# Patient Record
Sex: Male | Born: 1995 | Race: White | Hispanic: No | Marital: Single | Smoking: Never smoker
Health system: Southern US, Community
[De-identification: ages and names within clinical notes are randomized; demographics above are authoritative.]

## PROBLEM LIST (undated history)

## (undated) DIAGNOSIS — F319 Bipolar disorder, unspecified: Secondary | ICD-10-CM

## (undated) DIAGNOSIS — F840 Autistic disorder: Secondary | ICD-10-CM

## (undated) DIAGNOSIS — J45909 Unspecified asthma, uncomplicated: Secondary | ICD-10-CM

## (undated) DIAGNOSIS — I341 Nonrheumatic mitral (valve) prolapse: Secondary | ICD-10-CM

## (undated) DIAGNOSIS — F419 Anxiety disorder, unspecified: Secondary | ICD-10-CM

## (undated) HISTORY — PX: MOUTH SURGERY: SHX715

---
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
## 2016-07-20 ENCOUNTER — Ambulatory Visit (INDEPENDENT_AMBULATORY_CARE_PROVIDER_SITE_OTHER): Payer: BLUE CROSS/BLUE SHIELD | Admitting: Psychology

## 2016-07-20 DIAGNOSIS — F840 Autistic disorder: Secondary | ICD-10-CM | POA: Diagnosis not present

## 2016-07-20 DIAGNOSIS — F3111 Bipolar disorder, current episode manic without psychotic features, mild: Secondary | ICD-10-CM | POA: Diagnosis not present

## 2016-08-10 ENCOUNTER — Ambulatory Visit (INDEPENDENT_AMBULATORY_CARE_PROVIDER_SITE_OTHER): Payer: BLUE CROSS/BLUE SHIELD | Admitting: Psychology

## 2016-08-10 DIAGNOSIS — F840 Autistic disorder: Secondary | ICD-10-CM | POA: Diagnosis not present

## 2016-08-10 DIAGNOSIS — F3111 Bipolar disorder, current episode manic without psychotic features, mild: Secondary | ICD-10-CM

## 2016-08-15 ENCOUNTER — Ambulatory Visit (INDEPENDENT_AMBULATORY_CARE_PROVIDER_SITE_OTHER): Payer: BLUE CROSS/BLUE SHIELD | Admitting: Psychology

## 2016-08-15 DIAGNOSIS — F3181 Bipolar II disorder: Secondary | ICD-10-CM | POA: Diagnosis not present

## 2016-08-15 DIAGNOSIS — F840 Autistic disorder: Secondary | ICD-10-CM | POA: Diagnosis not present

## 2016-08-15 DIAGNOSIS — F70 Mild intellectual disabilities: Secondary | ICD-10-CM

## 2016-09-13 ENCOUNTER — Ambulatory Visit (INDEPENDENT_AMBULATORY_CARE_PROVIDER_SITE_OTHER): Payer: BLUE CROSS/BLUE SHIELD | Admitting: Psychology

## 2016-09-13 DIAGNOSIS — F70 Mild intellectual disabilities: Secondary | ICD-10-CM | POA: Diagnosis not present

## 2016-09-13 DIAGNOSIS — F840 Autistic disorder: Secondary | ICD-10-CM

## 2016-09-13 DIAGNOSIS — F3181 Bipolar II disorder: Secondary | ICD-10-CM | POA: Diagnosis not present

## 2016-11-04 ENCOUNTER — Ambulatory Visit (INDEPENDENT_AMBULATORY_CARE_PROVIDER_SITE_OTHER): Payer: BLUE CROSS/BLUE SHIELD | Admitting: Psychology

## 2016-11-04 DIAGNOSIS — F840 Autistic disorder: Secondary | ICD-10-CM

## 2016-11-04 DIAGNOSIS — F3181 Bipolar II disorder: Secondary | ICD-10-CM | POA: Diagnosis not present

## 2016-12-13 ENCOUNTER — Ambulatory Visit (INDEPENDENT_AMBULATORY_CARE_PROVIDER_SITE_OTHER): Payer: BLUE CROSS/BLUE SHIELD | Admitting: Psychology

## 2016-12-13 DIAGNOSIS — F3181 Bipolar II disorder: Secondary | ICD-10-CM

## 2016-12-13 DIAGNOSIS — F900 Attention-deficit hyperactivity disorder, predominantly inattentive type: Secondary | ICD-10-CM

## 2016-12-13 DIAGNOSIS — F840 Autistic disorder: Secondary | ICD-10-CM | POA: Diagnosis not present

## 2016-12-26 ENCOUNTER — Ambulatory Visit: Payer: BLUE CROSS/BLUE SHIELD | Admitting: Psychology

## 2017-01-12 ENCOUNTER — Ambulatory Visit (INDEPENDENT_AMBULATORY_CARE_PROVIDER_SITE_OTHER): Payer: BLUE CROSS/BLUE SHIELD | Admitting: Psychology

## 2017-01-12 DIAGNOSIS — F70 Mild intellectual disabilities: Secondary | ICD-10-CM

## 2017-01-12 DIAGNOSIS — F840 Autistic disorder: Secondary | ICD-10-CM

## 2017-01-12 DIAGNOSIS — F3181 Bipolar II disorder: Secondary | ICD-10-CM

## 2017-01-23 ENCOUNTER — Ambulatory Visit (INDEPENDENT_AMBULATORY_CARE_PROVIDER_SITE_OTHER): Payer: BLUE CROSS/BLUE SHIELD | Admitting: Psychology

## 2017-01-23 DIAGNOSIS — F840 Autistic disorder: Secondary | ICD-10-CM

## 2017-01-23 DIAGNOSIS — F3181 Bipolar II disorder: Secondary | ICD-10-CM | POA: Diagnosis not present

## 2017-01-23 DIAGNOSIS — F70 Mild intellectual disabilities: Secondary | ICD-10-CM | POA: Diagnosis not present

## 2017-02-09 ENCOUNTER — Ambulatory Visit: Payer: BLUE CROSS/BLUE SHIELD | Admitting: Psychology

## 2017-07-11 ENCOUNTER — Other Ambulatory Visit: Payer: Self-pay | Admitting: Student

## 2017-07-11 DIAGNOSIS — R1013 Epigastric pain: Secondary | ICD-10-CM

## 2017-07-11 DIAGNOSIS — R112 Nausea with vomiting, unspecified: Secondary | ICD-10-CM

## 2017-07-14 ENCOUNTER — Ambulatory Visit
Admission: RE | Admit: 2017-07-14 | Discharge: 2017-07-14 | Disposition: A | Discharge status: Home / Self Care | Payer: BLUE CROSS/BLUE SHIELD | Source: Ambulatory Visit | Attending: Student | Admitting: Student

## 2017-07-14 DIAGNOSIS — R1013 Epigastric pain: Secondary | ICD-10-CM | POA: Insufficient documentation

## 2017-07-14 DIAGNOSIS — K219 Gastro-esophageal reflux disease without esophagitis: Secondary | ICD-10-CM | POA: Insufficient documentation

## 2017-07-14 DIAGNOSIS — R112 Nausea with vomiting, unspecified: Secondary | ICD-10-CM | POA: Insufficient documentation

## 2017-09-14 ENCOUNTER — Ambulatory Visit (INDEPENDENT_AMBULATORY_CARE_PROVIDER_SITE_OTHER): Payer: Medicaid Other | Admitting: Psychology

## 2017-09-14 DIAGNOSIS — F3181 Bipolar II disorder: Secondary | ICD-10-CM

## 2017-09-14 DIAGNOSIS — F70 Mild intellectual disabilities: Secondary | ICD-10-CM | POA: Diagnosis not present

## 2017-09-14 DIAGNOSIS — F840 Autistic disorder: Secondary | ICD-10-CM

## 2017-09-21 ENCOUNTER — Ambulatory Visit (INDEPENDENT_AMBULATORY_CARE_PROVIDER_SITE_OTHER): Payer: Medicaid Other | Admitting: Psychology

## 2017-09-21 DIAGNOSIS — F840 Autistic disorder: Secondary | ICD-10-CM

## 2017-09-21 DIAGNOSIS — F70 Mild intellectual disabilities: Secondary | ICD-10-CM

## 2017-09-21 DIAGNOSIS — F3181 Bipolar II disorder: Secondary | ICD-10-CM | POA: Diagnosis not present

## 2017-09-28 ENCOUNTER — Ambulatory Visit (INDEPENDENT_AMBULATORY_CARE_PROVIDER_SITE_OTHER): Payer: Medicaid Other | Admitting: Psychology

## 2017-09-28 DIAGNOSIS — F3181 Bipolar II disorder: Secondary | ICD-10-CM | POA: Diagnosis not present

## 2017-09-28 DIAGNOSIS — F840 Autistic disorder: Secondary | ICD-10-CM

## 2017-09-28 DIAGNOSIS — F70 Mild intellectual disabilities: Secondary | ICD-10-CM | POA: Diagnosis not present

## 2017-11-01 ENCOUNTER — Ambulatory Visit (INDEPENDENT_AMBULATORY_CARE_PROVIDER_SITE_OTHER): Payer: Medicaid Other | Admitting: Psychology

## 2017-11-01 DIAGNOSIS — F840 Autistic disorder: Secondary | ICD-10-CM

## 2017-11-01 DIAGNOSIS — F3181 Bipolar II disorder: Secondary | ICD-10-CM

## 2017-11-13 ENCOUNTER — Ambulatory Visit: Payer: Medicaid Other | Admitting: Psychology

## 2017-11-28 ENCOUNTER — Ambulatory Visit: Payer: Medicaid Other | Admitting: Psychology

## 2017-12-12 ENCOUNTER — Ambulatory Visit: Payer: Medicaid Other | Admitting: Psychology

## 2017-12-26 ENCOUNTER — Ambulatory Visit: Payer: Medicaid Other | Admitting: Psychology

## 2018-10-16 IMAGING — RF DG UGI W/ SMALL BOWEL HIGH DENSITY
8 of 11 series · 14 of 24 positions shown · non-contrast
Comparison: None.

CLINICAL DATA: Choking sensation with hematemesis.

EXAM:
UPPER GI SERIES WITH SMALL BOWEL FOLLOW-THROUGH
FLUOROSCOPY TIME:  Fluoroscopy Time:  1 minutes 54 seconds
Radiation Exposure Index (if provided by the fluoroscopic device):
159.30 mGy
Number of Acquired Spot Images: 23
TECHNIQUE: Single contrast upper GI series using thin barium performed.
Subsequently, serial images of the small bowel were obtained
including spot views of the terminal ileum.

[Series 1: t abdomen supine · 0.14mm/px · 1 of 2 slices shown]
[im 1/2]
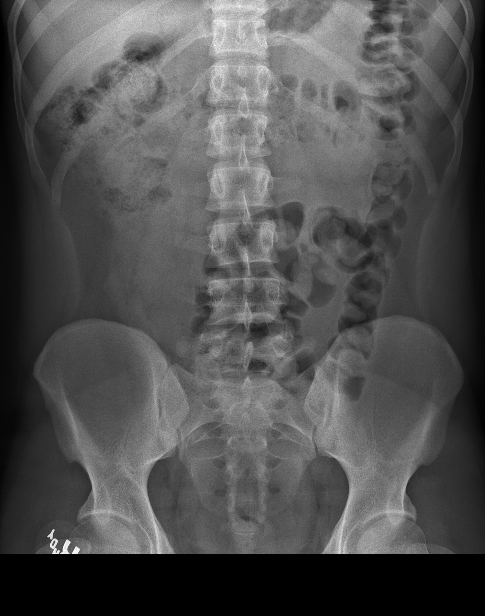

[Series 3: fluoro_barium 2fps_bw · 0.17mm/px · 1 of 1 slices shown (1 of 6)]
[im 1/1]
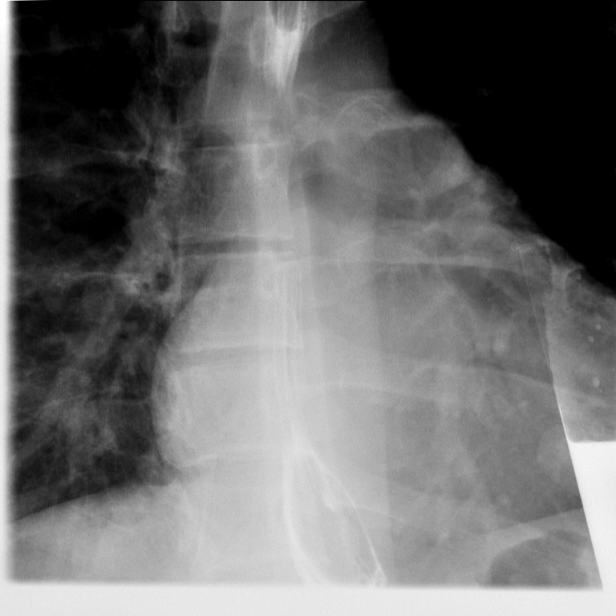

[Series 4: fluoro_barium 2fps_bw · 0.17mm/px · 1 of 3 frames shown (2 of 6)]
[frame 2/3]
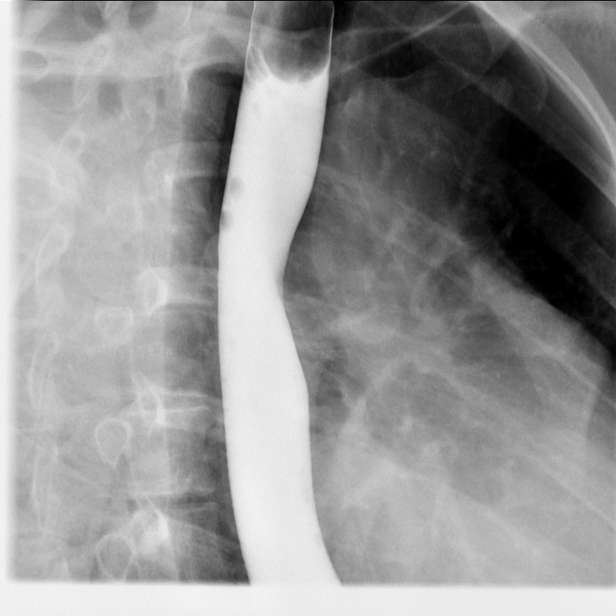

[Series 5: fluoro_barium 2fps_bw · 0.17mm/px · 2 of 3 frames shown (3 of 6)]
[frame 2/3]
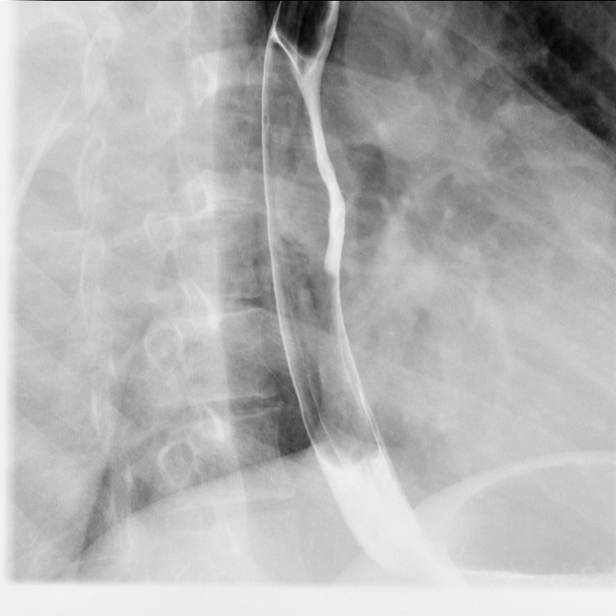
[frame 3/3]
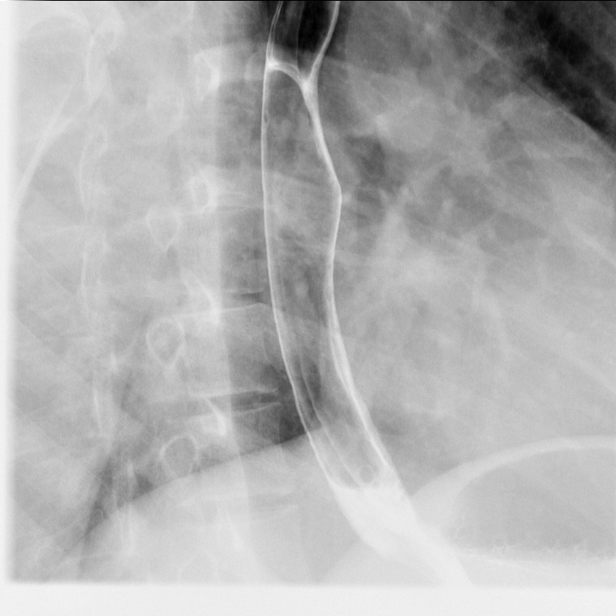

[Series 6: fluoro_barium 2fps_bw · 0.17mm/px · 1 of 3 frames shown (4 of 6)]
[frame 3/3]
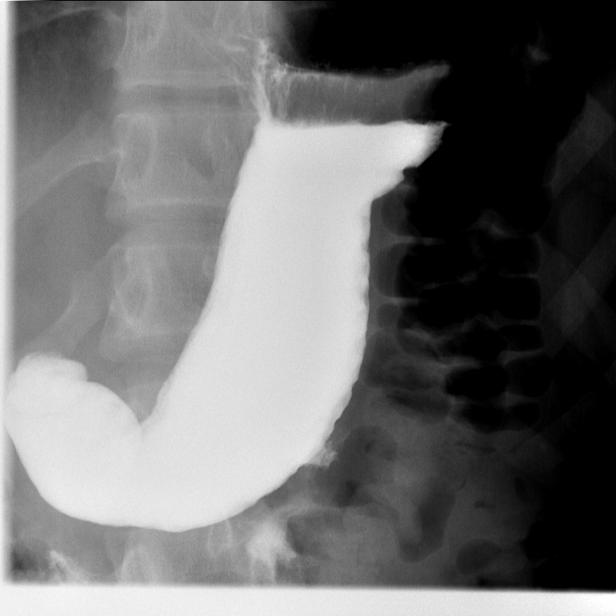

[Series 7: fluoro_barium singleshot_bw · 0.19mm/px · 6 of 15 slices shown]
[im 3/15]
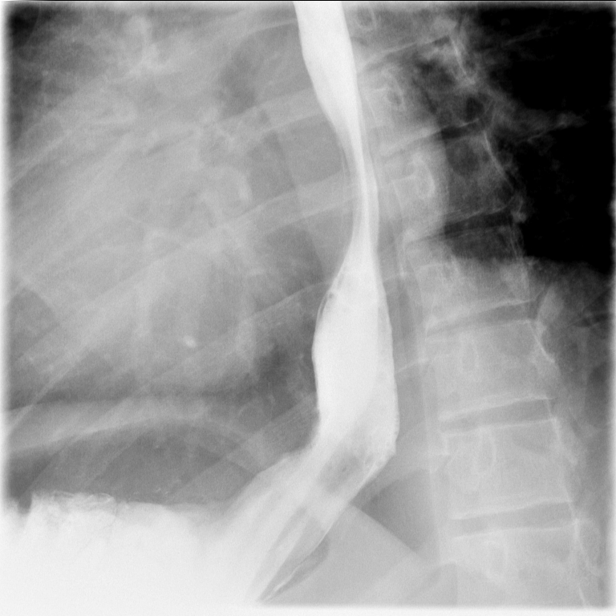
[im 4/15]
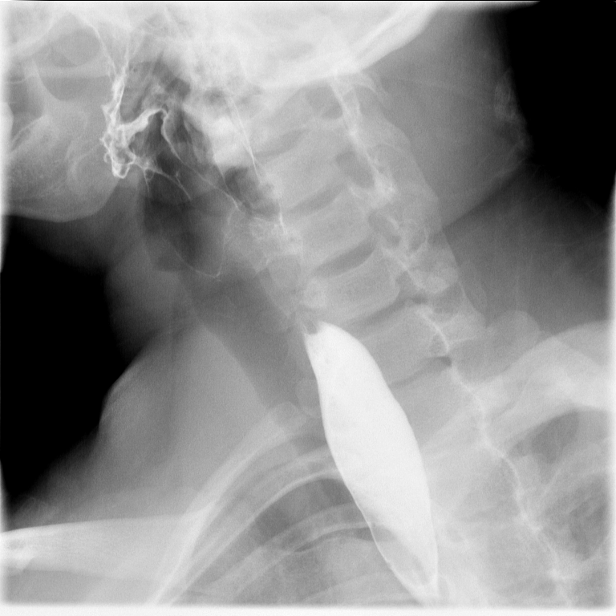
[im 7/15]
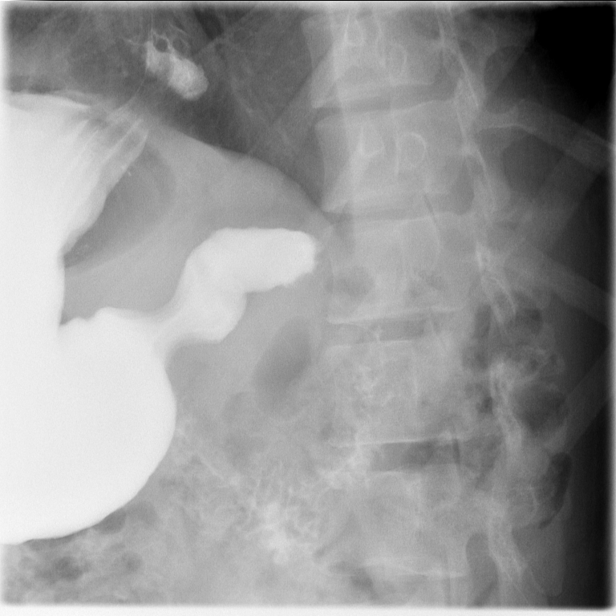
[im 10/15]
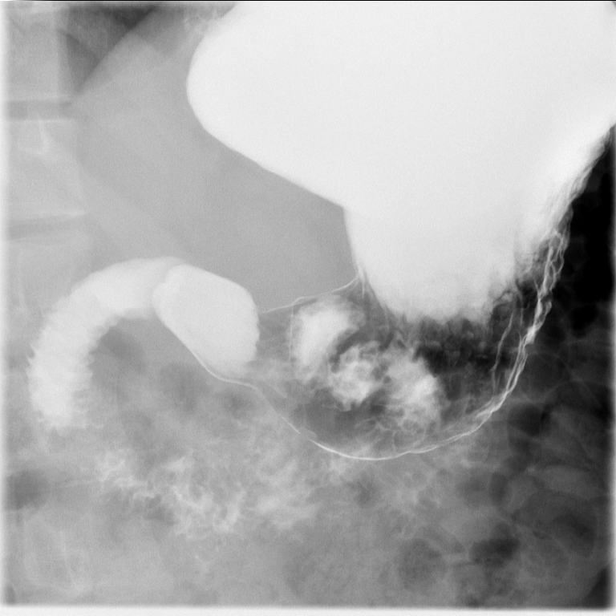
[im 12/15]
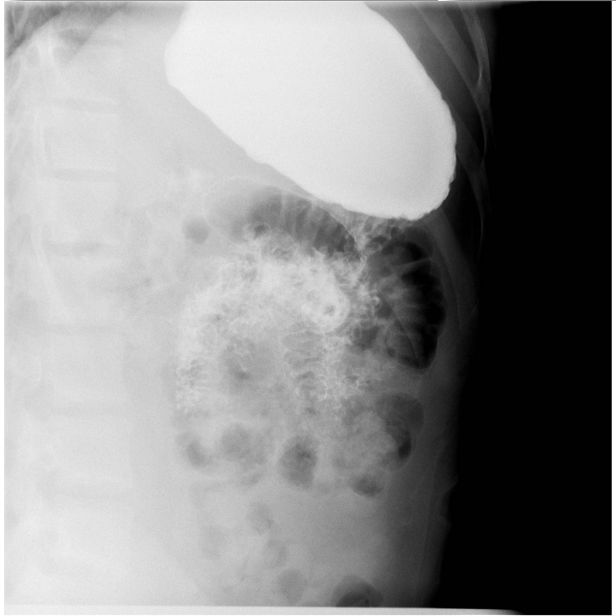
[im 15/15]
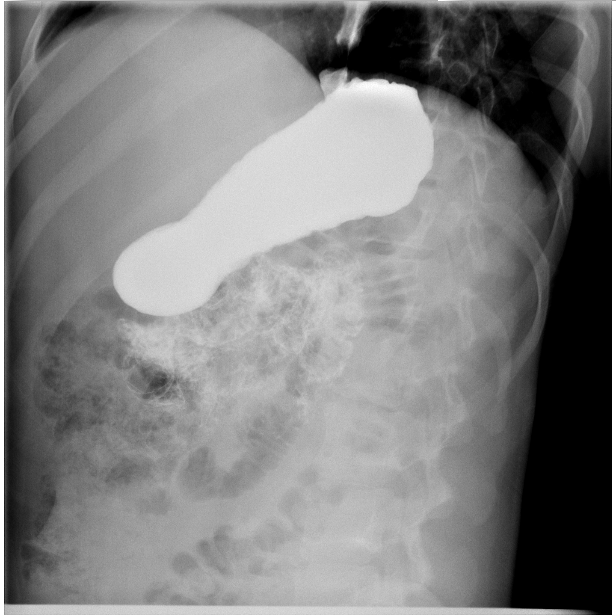

[Series 24: fluoro_barium 2fps_bw · 0.18mm/px · 1 of 2 frames shown (5 of 6)]
[frame 1/2]
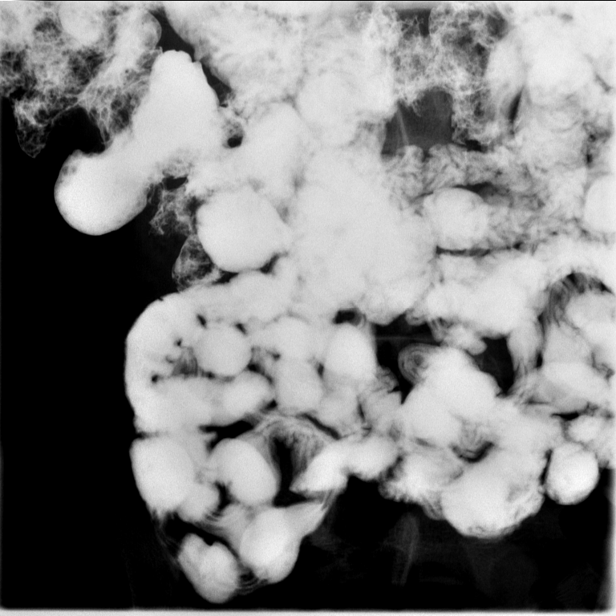

[Series 26: fluoro_barium 2fps_bw · 0.18mm/px · 1 of 1 slices shown (6 of 6)]
[im 1/1]
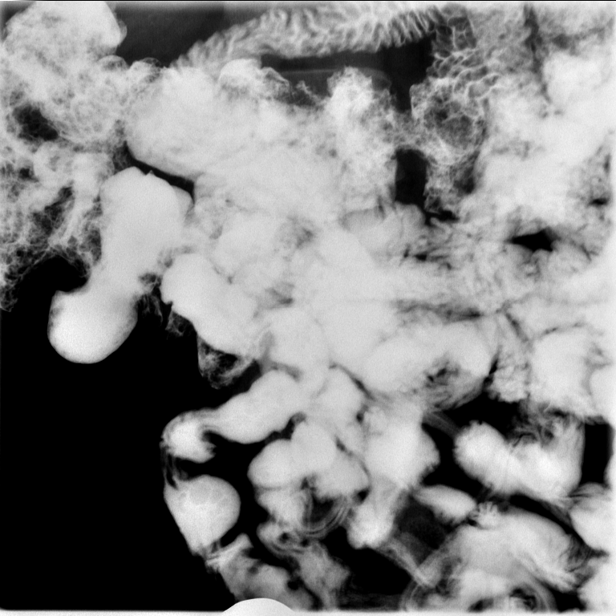

[14 of 24 positions shown; findings below may reference images not displayed]

FINDINGS: Preliminary abdomen image shows a normal gas pattern. There is
moderate stool in the colon.

Swallowing and peristalsis appear normal. There is no appreciable
hiatal hernia or stricture. There is minimal reflux into the lower
esophagus which empties rapidly. There is no esophageal mass or
ulceration. Pharynx appears normal.

Stomach appears normal without appreciable mucosal defect or wall
thickening. No gastric mass or ulceration identified.

Duodenum appears normal without fold thickening, mass, or calculus.

Contrast reaches the colon after slightly greater than 30 minutes.
Image and fluoroscopic evaluation of the small intestine reveals no
fixation, angulation, tethering, or mucosal lesion. No fistula or
ulceration. No extrinsic mass or small bowel obstruction. Terminal
ileum appears normal.
IMPRESSION: Slight spontaneous gastroesophageal reflux without hiatal hernia or
stricture. Study otherwise within normal limits.

## 2020-01-10 ENCOUNTER — Ambulatory Visit: Payer: Medicaid Other | Attending: Internal Medicine

## 2020-01-10 DIAGNOSIS — Z23 Encounter for immunization: Secondary | ICD-10-CM

## 2020-01-10 NOTE — Progress Notes (Signed)
   Covid-19 Vaccination Clinic  Name:  Glen Boyle    MRN: [id number] DOB: [birth year]  01/10/2020  Mr. Mester was observed post Covid-19 immunization for 15 minutes without incident. He was provided with Vaccine Information Sheet and instruction to access the V-Safe system.   Mr. Werth was instructed to call 911 with any severe reactions post vaccine: Marland Kitchen Difficulty breathing  . Swelling of face and throat  . A fast heartbeat  . A bad rash all over body  . Dizziness and weakness   Immunizations Administered    Name Date Dose VIS Date Route   Pfizer COVID-19 Vaccine 01/10/2020  3:46 PM 0.3 mL 09/27/2019 Intramuscular   Manufacturer: [employer], [employer]   Lot: [id number]   NDC: [address]-0

## 2020-01-23 ENCOUNTER — Other Ambulatory Visit: Payer: Self-pay | Admitting: Student

## 2020-01-23 DIAGNOSIS — R1110 Vomiting, unspecified: Secondary | ICD-10-CM

## 2020-01-30 ENCOUNTER — Ambulatory Visit: Payer: Medicaid Other

## 2020-02-04 ENCOUNTER — Ambulatory Visit: Payer: Medicaid Other | Attending: Internal Medicine

## 2020-02-04 DIAGNOSIS — Z23 Encounter for immunization: Secondary | ICD-10-CM

## 2020-02-04 NOTE — Progress Notes (Signed)
   Covid-19 Vaccination Clinic  Name:  ICHOLAS IRBY    MRN: [id number] DOB: 28-Mar-1996  02/04/2020  Mr. Graffam was observed post Covid-19 immunization for 15 minutes without incident. He was provided with Vaccine Information Sheet and instruction to access the V-Safe system.   Mr. Xiang was instructed to call 911 with any severe reactions post vaccine: Marland Kitchen Difficulty breathing  . Swelling of face and throat  . A fast heartbeat  . A bad rash all over body  . Dizziness and weakness   Immunizations Administered    Name Date Dose VIS Date Route   Pfizer COVID-19 Vaccine 02/04/2020  4:06 PM 0.3 mL 12/11/2018 Intramuscular   Manufacturer: [employer], [employer]   Lot: [id number]   NDC: [address]-6

## 2020-02-07 ENCOUNTER — Other Ambulatory Visit: Payer: Self-pay

## 2020-02-07 ENCOUNTER — Ambulatory Visit
Admission: RE | Admit: 2020-02-07 | Discharge: 2020-02-07 | Disposition: A | Discharge status: Home / Self Care | Payer: Medicaid Other | Source: Ambulatory Visit | Attending: Student | Admitting: Student

## 2020-02-07 DIAGNOSIS — R1110 Vomiting, unspecified: Secondary | ICD-10-CM | POA: Insufficient documentation

## 2021-03-17 ENCOUNTER — Other Ambulatory Visit: Payer: Self-pay

## 2021-03-17 ENCOUNTER — Emergency Department (HOSPITAL_COMMUNITY)
Admission: EM | Admit: 2021-03-17 | Discharge: 2021-03-17 | Disposition: A | Discharge status: Home / Self Care | Payer: Medicaid Other | Attending: Emergency Medicine | Admitting: Emergency Medicine

## 2021-03-17 DIAGNOSIS — R45851 Suicidal ideations: Secondary | ICD-10-CM | POA: Insufficient documentation

## 2021-03-17 DIAGNOSIS — F319 Bipolar disorder, unspecified: Secondary | ICD-10-CM | POA: Diagnosis not present

## 2021-03-17 DIAGNOSIS — F840 Autistic disorder: Secondary | ICD-10-CM | POA: Diagnosis not present

## 2021-03-17 LAB — CBC WITH DIFFERENTIAL/PLATELET
Abs Immature Granulocytes: 0.03 10*3/uL (ref 0.00–0.07)
Basophils Absolute: 0.1 10*3/uL (ref 0.0–0.1)
Basophils Relative: 1 %
Eosinophils Absolute: 0.1 10*3/uL (ref 0.0–0.5)
Eosinophils Relative: 1 %
HCT: 45.2 % (ref 39.0–52.0)
Hemoglobin: 15.1 g/dL (ref 13.0–17.0)
Immature Granulocytes: 0 %
Lymphocytes Relative: 32 %
Lymphs Abs: 3.9 10*3/uL (ref 0.7–4.0)
MCH: 28.9 pg (ref 26.0–34.0)
MCHC: 33.4 g/dL (ref 30.0–36.0)
MCV: 86.6 fL (ref 80.0–100.0)
Monocytes Absolute: 1 10*3/uL (ref 0.1–1.0)
Monocytes Relative: 8 %
Neutro Abs: 7.1 10*3/uL (ref 1.7–7.7)
Neutrophils Relative %: 58 %
Platelets: 265 10*3/uL (ref 150–400)
RBC: 5.22 MIL/uL (ref 4.22–5.81)
RDW: 12.1 % (ref 11.5–15.5)
WBC: 12.2 10*3/uL — ABNORMAL HIGH (ref 4.0–10.5)
nRBC: 0 % (ref 0.0–0.2)

## 2021-03-17 LAB — SALICYLATE LEVEL: Salicylate Lvl: <7 mg/dL — ABNORMAL LOW (ref 7.0–30.0)

## 2021-03-17 LAB — BASIC METABOLIC PANEL
Anion gap: 7 (ref 5–15)
BUN: 10 mg/dL (ref 6–20)
CO2: 27 mmol/L (ref 22–32)
Calcium: 9.6 mg/dL (ref 8.9–10.3)
Chloride: 105 mmol/L (ref 98–111)
Creatinine, Ser: 1.13 mg/dL (ref 0.61–1.24)
GFR, Estimated: >60 mL/min (ref >60)
Glucose, Bld: 104 mg/dL — ABNORMAL HIGH (ref 70–99)
Potassium: 3.9 mmol/L (ref 3.5–5.1)
Sodium: 139 mmol/L (ref 135–145)

## 2021-03-17 LAB — ACETAMINOPHEN LEVEL: Acetaminophen (Tylenol), Serum: <10 ug/mL — ABNORMAL LOW (ref 10–30)

## 2021-03-17 LAB — RAPID URINE DRUG SCREEN, HOSP PERFORMED
Amphetamines: NOT DETECTED
Barbiturates: NOT DETECTED
Benzodiazepines: NOT DETECTED
Cocaine: NOT DETECTED
Opiates: NOT DETECTED
Tetrahydrocannabinol: NOT DETECTED

## 2021-03-17 LAB — ETHANOL: Alcohol, Ethyl (B): <10 mg/dL (ref <10)

## 2021-03-17 NOTE — Discharge Instructions (Addendum)
Call the behavioral health center for follow-up appointment as soon as possible.  Continue taking your usual medications.  Return here, if needed.

## 2021-03-17 NOTE — ED Provider Notes (Signed)
[facility]-EMERGENCY DEPT Provider Note   CSN: [id number] Arrival date & time: 03/17/21  0309     History Chief Complaint  Patient presents with  . Suicidal    COAL NEARHOOD is a 25 y.o. male.  Patient is a 25 year old male with history of bipolar and autism.  Patient brought by law enforcement for evaluation of suicidal ideation.  Patient was apparently found [place] [address] attempting to jump off of an overpass.  Patient tells me he does not recall this and that he "blacked out from all of the drama going on in his family".  It he denies to me that he feels suicidal or homicidal.  He denies any alcohol or drug use.  The history is provided by the patient.       Past Medical History:  Diagnosis Date  . Autism spectrum   . Mitral valve prolapse     There are no problems to display for this patient.   No past surgical history on file.     No family history on file.  Social History   Tobacco Use  . Smoking status: Never Smoker  Substance Use Topics  . Alcohol use: No  . Drug use: No    Home Medications Prior to Admission medications   Medication Sig Start Date End Date Taking? Authorizing Provider  ARIPiprazole (ABILIFY) 10 MG tablet Take 10 mg by mouth at bedtime.    [provider]  benztropine (COGENTIN) 2 MG tablet Take 2 mg by mouth at bedtime.    [provider]  bisacodyl (DULCOLAX) 5 MG EC tablet Take 5 mg by mouth daily as needed for constipation.    [provider]  GuanFACINE HCl (INTUNIV) 4 MG TB24 Take 1 tablet by mouth every morning.    [provider]    Allergies    Patient has no known allergies.  Review of Systems   Review of Systems  All other systems reviewed and are negative.   Physical Exam Updated Vital Signs BP 123/88 (BP Location: Right Arm)   Pulse 85   Temp 98.4 F (36.9 C) (Oral)   Resp 18   Ht 5\' 6"  (1.676 m)   Wt 72.6 kg   SpO2 98%   BMI 25.82 kg/m    Physical Exam Vitals and nursing note reviewed.  Constitutional:      General: He is not in acute distress.    Appearance: He is well-developed. He is not diaphoretic.  HENT:     Head: Normocephalic and atraumatic.  Cardiovascular:     Rate and Rhythm: Normal rate and regular rhythm.     Heart sounds: No murmur heard. No friction rub.  Pulmonary:     Effort: Pulmonary effort is normal. No respiratory distress.     Breath sounds: Normal breath sounds. No wheezing or rales.  Abdominal:     General: Bowel sounds are normal. There is no distension.     Palpations: Abdomen is soft.     Tenderness: There is no abdominal tenderness.  Musculoskeletal:        General: Normal range of motion.     Cervical back: Normal range of motion and neck supple.  Skin:    General: Skin is warm and dry.  Neurological:     Mental Status: He is alert and oriented to person, place, and time.     Coordination: Coordination normal.  Psychiatric:        Attention and Perception:  Attention normal.        Mood and Affect: Mood normal.        Speech: Speech normal.        Behavior: Behavior normal.        Thought Content: Thought content normal. Thought content does not include homicidal or suicidal ideation. Thought content does not include homicidal or suicidal plan.        Cognition and Memory: Cognition normal.        Judgment: Judgment is impulsive.     ED Results / Procedures / Treatments   Labs (all labs ordered are listed, but only abnormal results are displayed) Labs Reviewed - No data to display  EKG None  Radiology No results found.  Procedures Procedures   Medications Ordered in ED Medications - No data to display  ED Course  I have reviewed the triage vital signs and the nursing notes.  Pertinent labs & imaging results that were available during my care of the patient were reviewed by me and considered in my medical decision making (see chart for details).    MDM  Rules/Calculators/A&P  Patient presenting with suicidal ideation as described in the HPI.  TTS has been consulted and disposition pending their input.  Final Clinical Impression(s) / ED Diagnoses Final diagnoses:  None    Rx / DC Orders ED Discharge Orders    None       Geoffery Lyons, MD 03/18/21 [phone]

## 2021-03-17 NOTE — ED Notes (Signed)
Pt cant take abi;ify due to side affects of vomiting. Per mother Kennith [place]

## 2021-03-17 NOTE — ED Notes (Signed)
Pt given warm blanket.

## 2021-03-17 NOTE — ED Notes (Signed)
Reviewed discharge instructions with pt and mother, Teofil Maniaci, via teachback method. PT and mother verbalized understanding of instructions and deny any questions. No further care needed at this time

## 2021-03-17 NOTE — ED Notes (Signed)
[facility] [place], [place]  DrMarland Kitchen Marcello Fennel

## 2021-03-17 NOTE — BH Assessment (Addendum)
Comprehensive Clinical Assessment (CCA) Note  03/17/2021 KARLA VINES [id number]  Chief Complaint:  Chief Complaint  Patient presents with  . Suicidal   Visit Diagnosis: Bipolar Dx; Autism Disposition: Vernard Gambles, NP recommends psychiatric clearance and pt follow up with outpt therapy at [facility] ED from 03/17/2021 in [facility] HOSP[facility]-EMERGENCY DEPT  C-SSRS RISK CATEGORY High Risk    Therefore 1:1 sitter is recommended for suicide precaution.  The patient demonstrates the following risk factors for suicide: Chronic risk factors for suicide include: psychiatric disorder of Bipolar dx. Acute risk factors for suicide include: family or marital conflict and unemployment. Protective factors for this patient include: positive social support, responsibility to others (children, family) and life satisfaction. Considering these factors, the overall suicide risk at this point appears to be moderate. Patient may appropriate for outpatient follow up with safety plan in place.   Borna Wessinger is a 25 yo single male who presents to [facility] via [employer]. Pt was apparently found [place] the [place] of [address] attempting to jump off of an overpass within walking distance of his apt. Pt states he called for ambulance/911 himself & reported SI. Pt reports hx of Bipolar dx and Autism.  Pt called ambulance from bridge. He "guesses" he told dispatch he was having SI but states he was blacked out and doesn't remember having SI. Pt later stated "I was just mad". He is angry at his father for calling pt worthless and pt recently blocked father from contacting him.  Pt denies hx of past suicide attempts. He denies SI, HI and AVH. He is requesting to return home to his apartment.  Pt states he stopped taking all meds about 6 months ago, aside from Nyquil about once weekly for sleep trouble. Pt states one medicine upset his stomach. He states he is not interested in restarting medications  and doesn't know the names of any of them  He reports he is not currently in therapy or seeing a psychiatrist. He meets with [facility] apt/residential tx coordinator, Mardene Celeste, every Friday. HIPPA compliant voicemail left for [facility] with request for a return call.   Pt states he is angry at his father because his father called him worthless due to pt not having a job. 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 Pt states he would like another job, but in a different setting. Pt's mother agrees pt's father has said hurtful things to pt over the years.   By phone, pt's mother, was concerned that pt went to a bridge and reported SI. She states she thought he learned his lesson in the past - stating he once had SI when he didn't- and then spent over a week waiting for tx in an ED bed before being sent home. Mother reports pt calls the police for attention.  Pt reports he has friends in his apartment complex and that his mother and sister are supports. Pt is very fond of his young niece.  Pt gave verbal authorization to speak with his mother, Andee Lineman [phone] and also with his apt/residential tx [facility] coordinator [phone].  CCA Screening, Triage and Referral (STR)  Patient Reported Information How did you hear about [place]? Self  Referral name: Pt called ambulance that brought him to ED   Whom do you see for routine medical problems? Primary Care  Practice/Facility Name: in [place] - just started seeing him &  doesn't remember name   What Is the Reason for Your Visit/Call Today? Called the ambulance from bridge. He guesses he told dispatch he was having SI but he was blacked out and doesn't remember having SI. He reports relationship strain with family- father and uncle.  How Long Has This Been Causing You Problems? > than 6 months  What Do You Feel Would Help You the Most Today? Treatment for Depression or other  mood problem ("I just needed someone to talk to, don't need to be in a hospital" Agreeable to going to therapy)   Have You Recently Been in Any Inpatient Treatment (Hospital/Detox/Crisis Center/28-Day Program)? No   Have You Ever Received Services From [facility] Before? Yes  Who Do You See at [facility]? ED in past   Have You Recently Had Any Thoughts About Hurting Yourself? No  Are You Planning to Commit Suicide/Harm Yourself At This time? No   Have you Recently Had Thoughts About Hurting Someone Karolee Ohs? No   Have You Used Any Alcohol or Drugs in the Past 24 Hours? No   Do You Currently Have a Therapist/Psychiatrist? No (used to have therapist but he just talked about himself)   Have You Been Recently Discharged From Any Office Practice or Programs? No     CCA Screening Triage Referral Assessment Type of Contact: Tele-Assessment  Is this Initial or Reassessment? Initial Assessment  Date Telepsych consult ordered in [facility]:  03/17/2021  Time Telepsych consult ordered in [facility]:  0650   Patient Reported Information Reviewed? Yes   Collateral Involvement: mother, Andee Lineman, [phone] (or [phone])   Is CPS involved or ever been involved? Never  Is APS involved or ever been involved? Never   Patient Determined To Be At Risk for Harm To Self or Others Based on Review of Patient Reported Information or Presenting Complaint? No Location of Assessment: [facility] ED   Does Patient Present under Involuntary Commitment? No   [place] of Residence: [place]   Patient Currently Receiving the Following Services: No data recorded  Determination of Need: Emergent (2 hours)   Options For Referral: Outpatient Therapy; Medication Management     CCA Biopsychosocial Intake/Chief Complaint:  Pt called ambulance from bridge. He "guesses" he told dispatch he was having SI but states he was blacked out and doesn't remember having SI. Pt later stated "I was just mad". He is angry  at his father for calling pt worthless and pt recently blocked father from contacting him.  Current Symptoms/Problems: Pt denies feelings of Depression. States he was "just mad". He is agreeable to following up with outpt therapy.   Patient Reported Schizophrenia/Schizoaffective Diagnosis in Past: No   Strengths: "My baby neice"  Preferences: prefer outpt; not interested in medications- states they never have helped him & hurts his stomach   Type of Services Patient Feels are Needed: outpt therapy   Initial Clinical Notes/Concerns: pt minimizing events preceeding ED visit   Mental Health Symptoms Depression:  Fatigue; Hopelessness; Sleep (too much or little); Worthlessness (5pm- 10or 11am)   Duration of Depressive symptoms: Greater than two weeks   Mania:  None   Anxiety:   Fatigue; Worrying ("I pace because I have special needs- Bipolar & Autism - and something else I can't remember")   Psychosis:  None   Duration of Psychotic symptoms: No data recorded  Trauma:  None   Obsessions:  Poor insight (me getting back on my PC; father calling him worthless)   Compulsions:  None  Inattention:  None   Hyperactivity/Impulsivity:  N/A   Oppositional/Defiant Behaviors:  None   Emotional Irregularity:  Intense/inappropriate anger   Other Mood/Personality Symptoms:  No data recorded   Mental Status Exam Appearance and self-care  Stature:  Average   Weight:  Average weight   Clothing:  Casual   Grooming:  Normal   Cosmetic use:  None   Posture/gait:  Tense   Motor activity:  Not Remarkable   Sensorium  Attention:  Normal   Concentration:  Normal   Orientation:  X5   Recall/memory:  -- (UTA memory)   Affect and Mood  Affect:  Constricted; Appropriate   Mood:  Euthymic   Relating  Eye contact:  Normal   Facial expression:  Constricted; Tense   Attitude toward examiner:  Cooperative   Thought and Language  Speech flow: Clear and Coherent    Thought content:  Appropriate to Mood and Circumstances   Preoccupation:  None   Hallucinations:  None   Organization:  No data recorded  Affiliated Computer ServicesExecutive Functions  Fund of Knowledge:  Average   Intelligence:  Average   Abstraction:  Concrete   Judgement:  Fair   Reality Testing:  Adequate   Insight:  Gaps   Decision Making:  Only simple   Social Functioning  Social Maturity:  Responsible   Social Judgement:  Naive   Stress  Stressors:  Family conflict   Coping Ability:  Deficient supports   Skill Deficits:  Decision making   Supports:  Family (mother, sister, brother-in-law, niece)     Religion: Religion/Spirituality Are You A Religious Person?: Yes What is Your Religious Affiliation?: ChiropodistChristian  Leisure/Recreation: Leisure / Recreation Do You Have Hobbies?: Yes Leisure and Hobbies: games on [employer]  Exercise/Diet: Exercise/Diet Have You Gained or Lost A Significant Amount of Weight in the Past Six Months?: No Do You Follow a Special Diet?: No Do You Have Any Trouble Sleeping?: No   CCA Employment/Education Employment/Work Situation: Employment / Work Psychologist, occupationalituation Employment situation: Unemployed What is the longest time patient has a held a job?: 2 years Where was the patient employed at that time?: [employer] Has patient ever been in the Eli Lilly and Companymilitary?: No  Education: Education Is Patient Currently Attending School?: No Did Garment/textile technologistYou Graduate From [employer]High School?: Yes Did Theme park managerYou Attend College?: No Did Designer, television/film setYou Attend Graduate School?: No Did You Have An Individualized Education Program (IIEP): No ("don't think so") Did You Have Any Difficulty At Progress EnergySchool?: Yes (bullying) Were Any Medications Ever Prescribed For These Difficulties?: No Patient's Education Has Been Impacted by Current Illness: No   CCA Family/Childhood History Family and Relationship History: Family history Marital status: Single What is your sexual orientation?: "girls" Does patient have children?:  No  Childhood History:  Childhood History By whom was/is the patient raised?: Mother/father and step-parent Description of patient's relationship with caregiver when they were a child: good with mom; not so good with Dad Patient's description of current relationship with people who raised him/her: anger with father, who pt states thinks he is worthless How were you disciplined when you got in trouble as a child/adolescent?: "don't remember that far" Does patient have siblings?: Yes Number of Siblings: 1 Description of patient's current relationship with siblings: close with sister and her daughter Did patient suffer any verbal/emotional/physical/sexual abuse as a child?: No Did patient suffer from severe childhood neglect?: No Has patient ever been sexually abused/assaulted/raped as an adolescent or adult?: No Was the patient ever a victim of a crime or a  disaster?: No Witnessed domestic violence?: No Has patient been affected by domestic violence as an adult?: No    CCA Substance Use Alcohol/Drug Use: Alcohol / Drug Use Pain Medications: Pt denies- See MAR Prescriptions: Pt denies- See MAR Over the Counter: Nyquil for sleep - about once weekly History of alcohol / drug use?: No history of alcohol / drug abuse      DSM5 Diagnoses: There are no problems to display for this patient.   Patient Centered Plan: Patient is on the following Treatment Plan(s):  Impulse Control  Pt's RN was advised of disposition recommendation 17:05   Nicasio Barlowe Suzan Nailer, LCSW

## 2021-03-17 NOTE — ED Provider Notes (Signed)
5:40 PM-patient has been seen and cleared by psychiatry.  This point patient is standing in the hallway, with his mother at his side.  He is making some random statements which are peculiar.  His mother states she has not had any concerns or questions about discharge planning.  Patient to follow-up at [facility], after scheduling appointment.   Mancel Bale, MD 03/17/21 [phone]

## 2021-03-17 NOTE — ED Triage Notes (Addendum)
[place] [address] [place] of the overpass. HX bipolar and autism. Pt denies ETOH and drug use.

## 2021-03-17 NOTE — ED Notes (Signed)
Pt states that [employer] officer broke his phone and he is supposed to email the sergeant so they can send him a form for replacement. Contact info:  [email or web address] Case# [email or web address] Badge #[phone]

## 2021-03-17 NOTE — BH Assessment (Signed)
12:59  Request for TTS cart set up sent to pt's RN, Linford Arnold, through secure chat

## 2021-03-17 NOTE — ED Notes (Addendum)
Patient has been dressed out in burgundy scrubs and has been wanded  His belongings in the cabinet

## 2021-04-05 ENCOUNTER — Ambulatory Visit (HOSPITAL_COMMUNITY): Payer: Medicaid Other | Admitting: Psychiatry

## 2021-04-14 ENCOUNTER — Emergency Department (HOSPITAL_COMMUNITY)
Admission: EM | Admit: 2021-04-14 | Discharge: 2021-04-14 | Disposition: A | Discharge status: Home / Self Care | Payer: Medicaid Other | Attending: Emergency Medicine | Admitting: Emergency Medicine

## 2021-04-14 ENCOUNTER — Emergency Department (HOSPITAL_COMMUNITY): Payer: Medicaid Other

## 2021-04-14 DIAGNOSIS — R000 Tachycardia, unspecified: Secondary | ICD-10-CM | POA: Insufficient documentation

## 2021-04-14 DIAGNOSIS — R4182 Altered mental status, unspecified: Secondary | ICD-10-CM | POA: Insufficient documentation

## 2021-04-14 DIAGNOSIS — F419 Anxiety disorder, unspecified: Secondary | ICD-10-CM | POA: Insufficient documentation

## 2021-04-14 DIAGNOSIS — F1220 Cannabis dependence, uncomplicated: Secondary | ICD-10-CM | POA: Insufficient documentation

## 2021-04-14 LAB — I-STAT CHEM 8, ED
BUN: 7 mg/dL (ref 6–20)
Calcium, Ion: 1.33 mmol/L (ref 1.15–1.40)
Chloride: 103 mmol/L (ref 98–111)
Creatinine, Ser: 1.1 mg/dL (ref 0.61–1.24)
Glucose, Bld: 108 mg/dL — ABNORMAL HIGH (ref 70–99)
HCT: 45 % (ref 39.0–52.0)
Hemoglobin: 15.3 g/dL (ref 13.0–17.0)
Potassium: 4 mmol/L (ref 3.5–5.1)
Sodium: 139 mmol/L (ref 135–145)
TCO2: 26 mmol/L (ref 22–32)

## 2021-04-14 MED ORDER — LORAZEPAM 2 MG/ML IJ SOLN
1.0000 mg | Freq: Once | INTRAMUSCULAR | Status: AC
  Administered 2021-04-14: 1 mg via INTRAMUSCULAR
  Filled 2021-04-14: qty 1

## 2021-04-14 NOTE — Discharge Instructions (Addendum)
Please do not smoke THC vape used today.

## 2021-04-14 NOTE — ED Notes (Signed)
Pt to CT

## 2021-04-14 NOTE — ED Provider Notes (Signed)
[facility] EMERGENCY DEPARTMENT Provider Note   CSN: [id number] Arrival date & time: 04/14/21  1732     History Chief Complaint  Patient presents with   Anxiety    Glen Boyle is a 25 y.o. male.  Patient states that he started to feel extremely anxious after smoking vape with 8 percent CBD.  He denies any suicidal homicidal ideation.  He was found in the streets in distress.  He told EMS that he had used a very potent CBD and was not tolerating it very well.  Feeling very anxious.    The history is provided by the patient.  Anxiety This is a new problem. The current episode started less than 1 hour ago. The problem occurs constantly. The problem has not changed since onset.Pertinent negatives include no chest pain, no abdominal pain and no shortness of breath. Nothing aggravates the symptoms. Nothing relieves the symptoms. He has tried nothing for the symptoms. The treatment provided no relief.      Past Medical History:  Diagnosis Date   Autism spectrum    Mitral valve prolapse     There are no problems to display for this patient.   No past surgical history on file.     No family history on file.  Social History   Tobacco Use   Smoking status: Never  Substance Use Topics   Alcohol use: No   Drug use: No    Home Medications Prior to Admission medications   Medication Sig Start Date End Date Taking? Authorizing Provider  ARIPiprazole (ABILIFY) 10 MG tablet Take 10 mg by mouth at bedtime.    [provider]  benztropine (COGENTIN) 2 MG tablet Take 2 mg by mouth at bedtime.    [provider]  bisacodyl (DULCOLAX) 5 MG EC tablet Take 5 mg by mouth daily as needed for constipation.    [provider]  GuanFACINE HCl (INTUNIV) 4 MG TB24 Take 1 tablet by mouth every morning.    [provider]    Allergies    Patient has no known allergies.  Review of Systems   Review of Systems  Constitutional:   Negative for chills and fever.  HENT:  Negative for ear pain and sore throat.   Eyes:  Negative for pain and visual disturbance.  Respiratory:  Negative for cough and shortness of breath.   Cardiovascular:  Negative for chest pain and palpitations.  Gastrointestinal:  Negative for abdominal pain and vomiting.  Genitourinary:  Negative for dysuria and hematuria.  Musculoskeletal:  Negative for arthralgias and back pain.  Skin:  Negative for color change and rash.  Neurological:  Negative for seizures and syncope.  Psychiatric/Behavioral:  The patient is nervous/anxious and is hyperactive.   All other systems reviewed and are negative.  Physical Exam Updated Vital Signs BP 110/62 (BP Location: Left Arm)   Pulse 82   Temp 98 F (36.7 C) (Oral)   Resp 16   Ht 5\' 8"  (1.727 m)   Wt 76.2 kg   SpO2 97%   BMI 25.54 kg/m   Physical Exam Vitals and nursing note reviewed.  Constitutional:      General: He is not in acute distress.    Appearance: He is well-developed. He is not ill-appearing.  HENT:     Head: Normocephalic and atraumatic.  Eyes:     Conjunctiva/sclera: Conjunctivae normal.  Cardiovascular:     Rate and Rhythm: Normal rate and regular rhythm.  Pulses: Normal pulses.     Heart sounds: Normal heart sounds. No murmur heard. Pulmonary:     Effort: Pulmonary effort is normal. No respiratory distress.     Breath sounds: Normal breath sounds.  Abdominal:     Palpations: Abdomen is soft.     Tenderness: There is no abdominal tenderness.  Musculoskeletal:     Cervical back: Normal range of motion and neck supple.  Skin:    General: Skin is warm and dry.  Neurological:     General: No focal deficit present.     Mental Status: He is alert and oriented to person, place, and time.     Cranial Nerves: No cranial nerve deficit.     Sensory: No sensory deficit.     Motor: No weakness.  Psychiatric:        Attention and Perception: Attention normal.        Mood and  Affect: Mood is anxious.        Behavior: Behavior is cooperative.        Thought Content: Thought content does not include homicidal or suicidal ideation.    ED Results / Procedures / Treatments   Labs (all labs ordered are listed, but only abnormal results are displayed) Labs Reviewed  I-STAT CHEM 8, ED - Abnormal; Notable for the following components:      Result Value   Glucose, Bld 108 (*)    All other components within normal limits    EKG None  Radiology CT Head Wo Contrast  Result Date: 04/14/2021 CLINICAL DATA:  Altered mental status EXAM: CT HEAD WITHOUT CONTRAST TECHNIQUE: Contiguous axial images were obtained from the base of the skull through the vertex without intravenous contrast. COMPARISON:  CT brain 08/29/2006 FINDINGS: Brain: No evidence of acute infarction, hemorrhage, hydrocephalus, extra-axial collection or mass lesion/mass effect. Vascular: No hyperdense vessel or unexpected calcification. Skull: Normal. Negative for fracture or focal lesion. Sinuses/Orbits: No acute finding. Other: None IMPRESSION: Negative non contrasted CT appearance of the brain Electronically Signed   By: Jasmine Pang M.D.   On: 04/14/2021 21:31    Procedures Procedures   Medications Ordered in ED Medications  LORazepam (ATIVAN) injection 1 mg (1 mg Intramuscular Given 04/14/21 1749)    ED Course  I have reviewed the triage vital signs and the nursing notes.  Pertinent labs & imaging results that were available during my care of the patient were reviewed by me and considered in my medical decision making (see chart for details).    MDM Rules/Calculators/A&P                          Glen Boyle is a 25 year old male with history of autism who presents the ED after adverse reaction to CBD vape.  Mildly tachycardic but otherwise normal vitals.  States that he smoked a potent CBD and got very anxious afterwards.  Patient was found wandering the street.  Looking like he was in  distress.  EMS states that he has been very cooperative with them.  He denies any suicidal homicidal ideation.  He overall appears to be having adverse reaction to drugs that he took but no concern for allergic reaction at this time.  We will give him a dose of Ativan and allow him to metabolize and reevaluate.  Denies any trauma.  Overall he is neurologically intact and very cooperative.  11:13 PM patient reevaluated, resting comfortably and seems to be less anxious.  We will allow him to continue to metabolize.  11:13 PM patient reevaluated.  Head CT was normal.  Basic labs unremarkable.  He appears much more sober.  He is able to ambulate.  Overall suspect that patient is improving from [facility] side effects.  He has been able to call his mother to pick him up.  He is safe for discharge.  This chart was dictated using voice recognition software.  Despite best efforts to proofread,  errors can occur which can change the documentation meaning.  Final Clinical Impression(s) / ED Diagnoses Final diagnoses:  Tetrahydrocannabinol (THC) dependence [facility])    Rx / DC Orders ED Discharge Orders     None        Virgina Norfolk, DO 04/14/21 2313

## 2021-04-14 NOTE — ED Triage Notes (Signed)
Pt arrives via [facility]. EMS for anxiety attack after using CBD vape. Pt visibly anxious during triage, shaking his limbs. Minimal speech.   EMS last VS - HR 100, 128/70, 100% on RA, GCS 15

## 2021-05-11 IMAGING — US US ABDOMEN COMPLETE
1 series · 14 of 25 positions shown · non-contrast
Comparison: None.

CLINICAL DATA: Intermittent vomiting.

EXAM:
ABDOMEN ULTRASOUND COMPLETE

[Series 1: us abdomen complete · 14 of 86 slices shown]
[im 1/86]
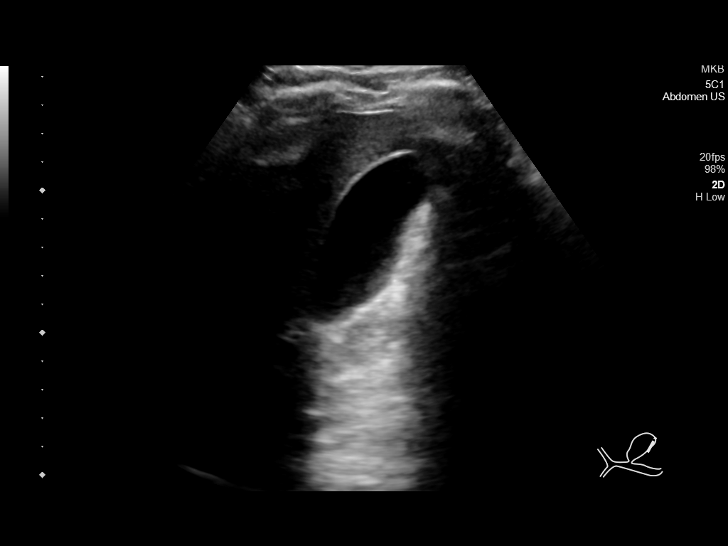
[im 8/86]
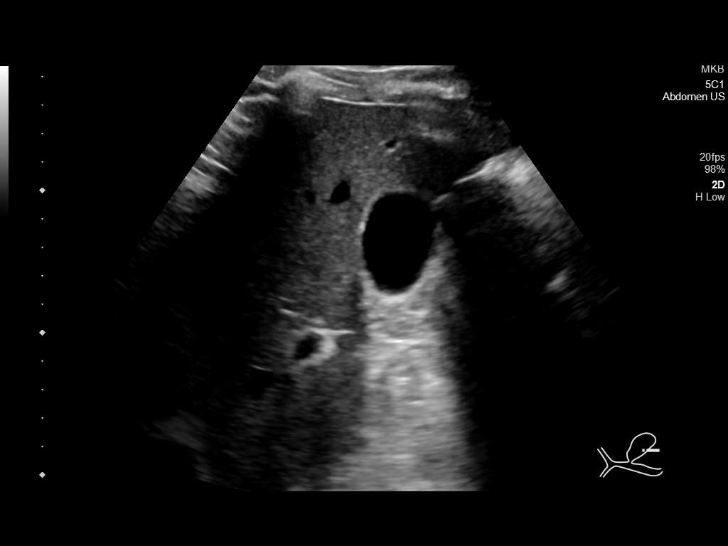
[im 15/86]
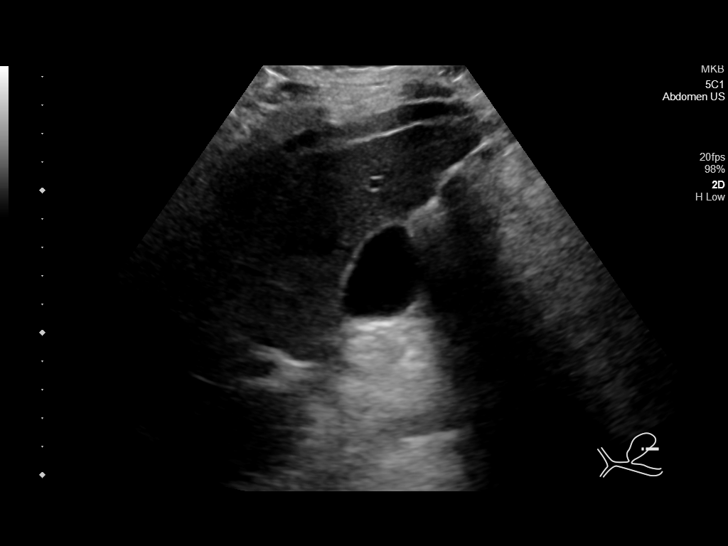
[im 22/86]
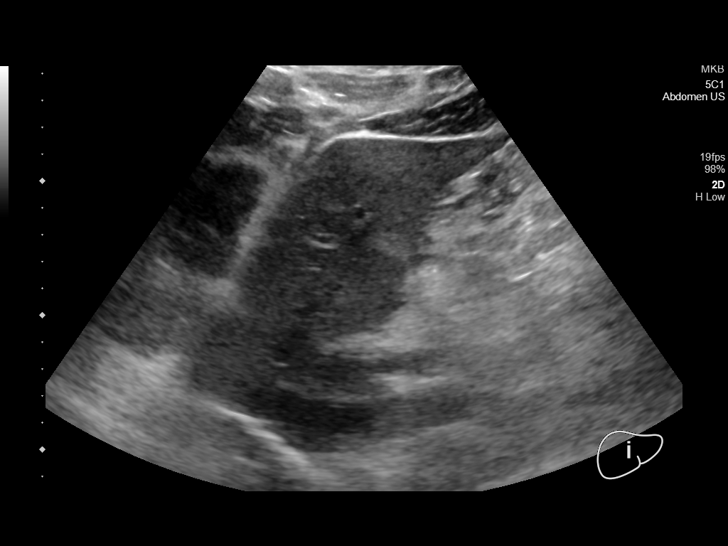
[im 29/86]
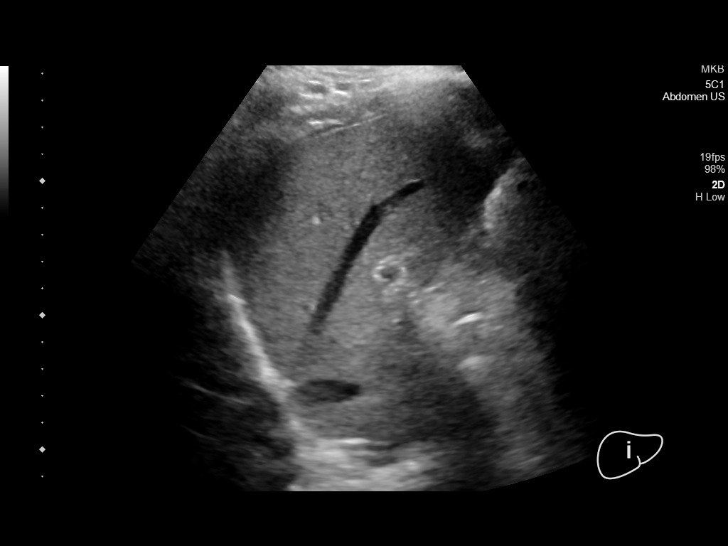
[im 32/86]
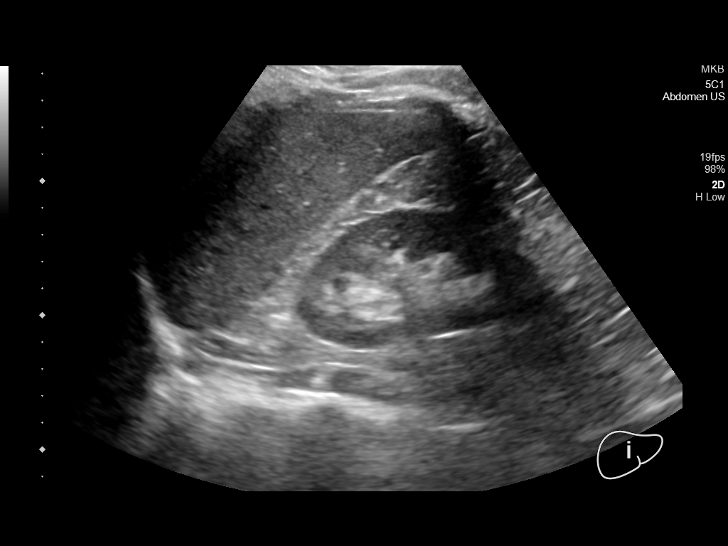
[im 39/86]
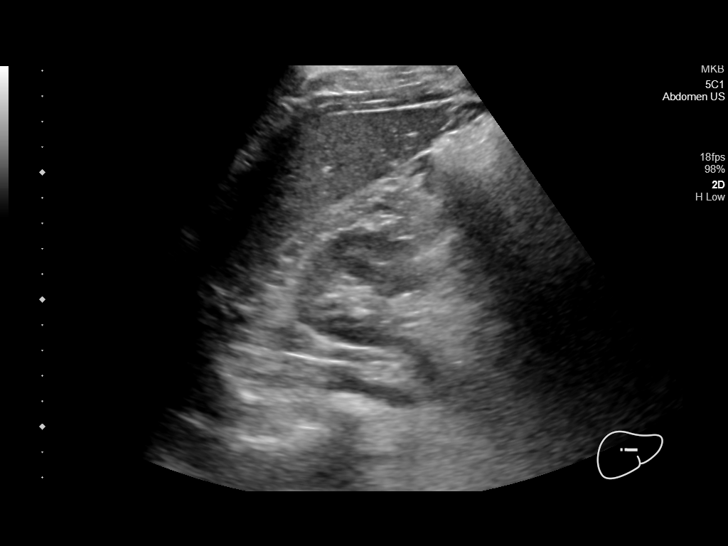
[im 47/86]
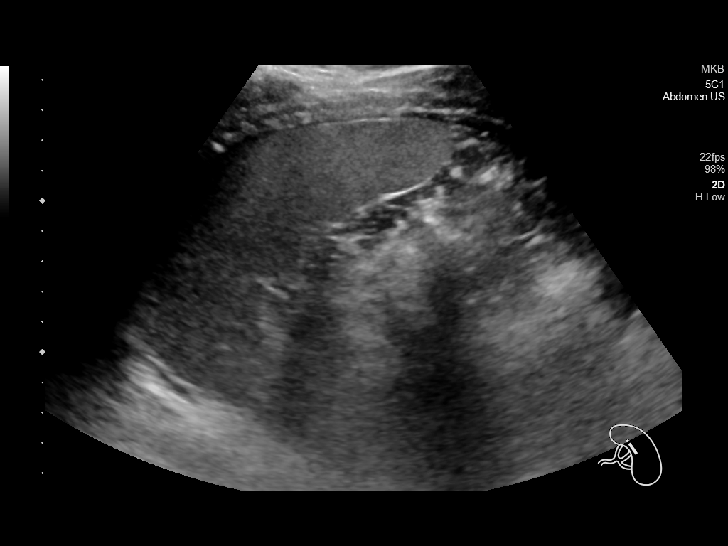
[im 54/86]
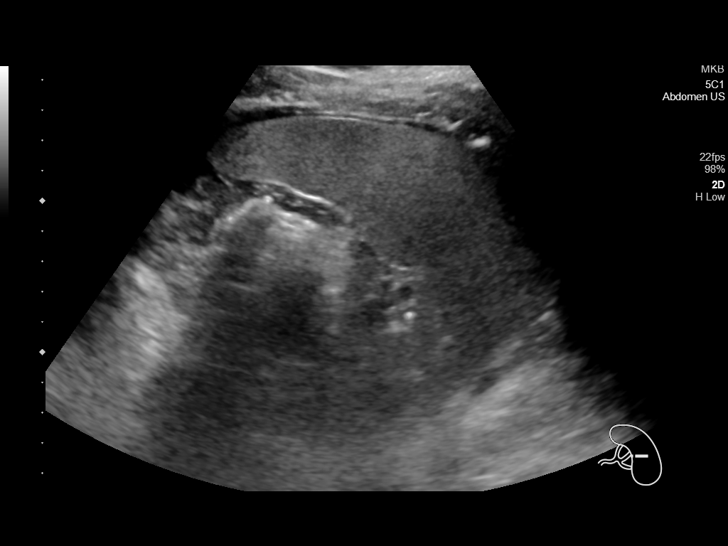
[im 57/86]
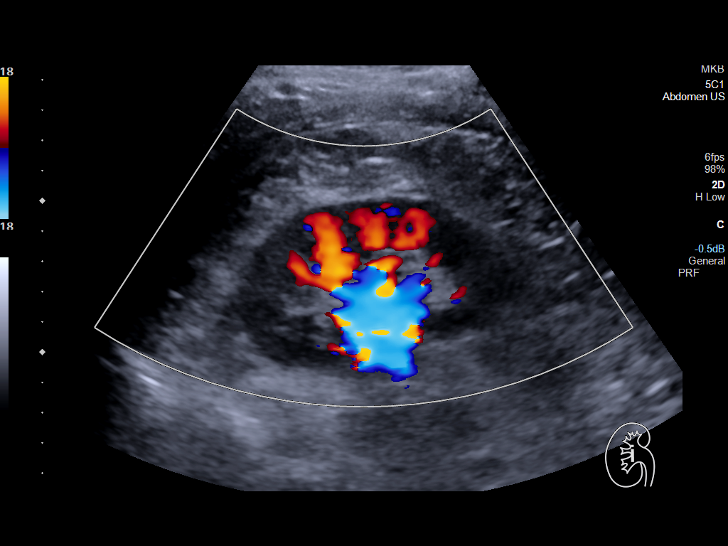
[im 64/86]
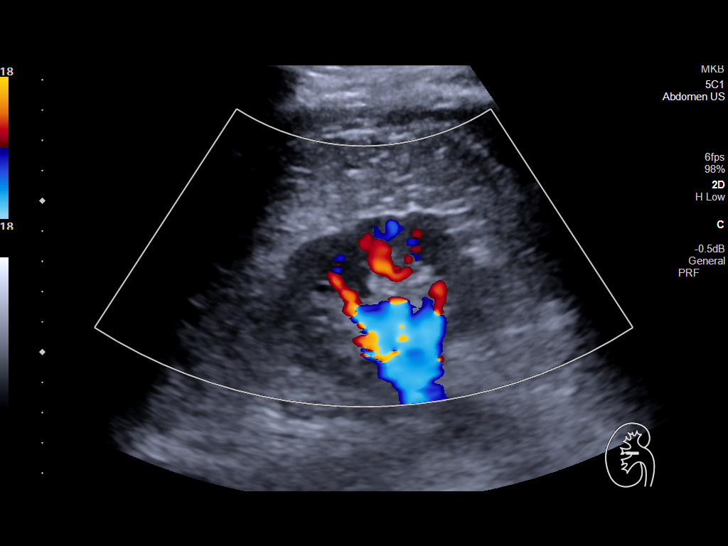
[im 71/86]
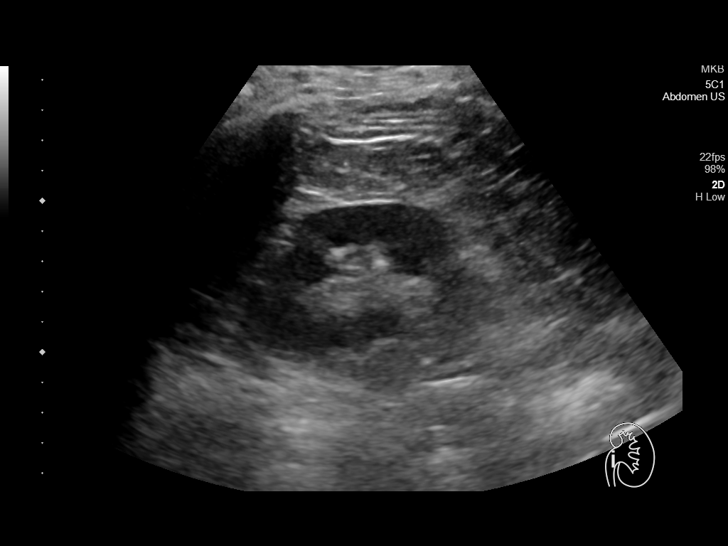
[im 78/86]
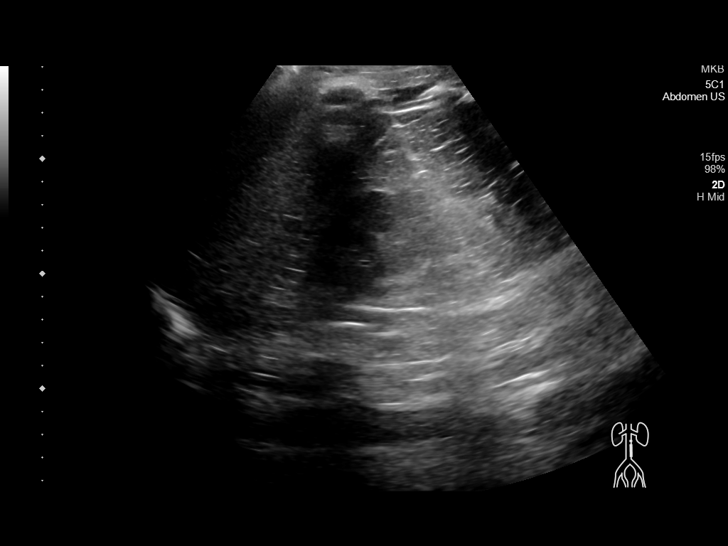
[im 86/86]
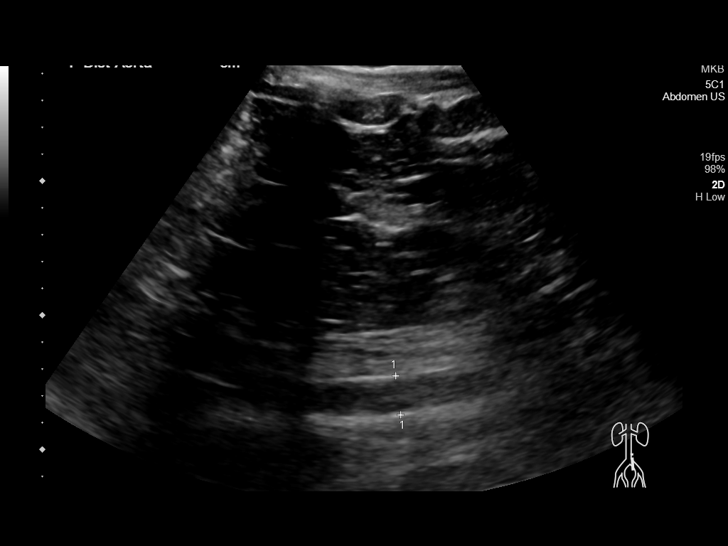

[14 of 25 positions shown; findings below may reference images not displayed]

FINDINGS: Gallbladder: No gallstones or wall thickening visualized. No
sonographic Murphy sign noted by sonographer.

Common bile duct: Diameter: 0.3 cm, within normal limits

Liver: No focal lesion identified. Within normal limits in
parenchymal echogenicity. Portal vein is patent on color Doppler
imaging with normal direction of blood flow towards the liver.

IVC: No abnormality visualized.

Pancreas: Poorly visualized.

Spleen: Size and appearance within normal limits.

Right Kidney: Length: 9.2 cm. Echogenicity within normal limits. No
mass or hydronephrosis visualized.

Left Kidney: Length: 9.7 cm. Echogenicity within normal limits. No
mass or hydronephrosis visualized.

Abdominal aorta: No aneurysm in visualized portions.

Other findings: None.
IMPRESSION: No sonographic finding to explain the patient's intermittent
vomiting.

## 2021-05-22 ENCOUNTER — Emergency Department (HOSPITAL_COMMUNITY)
Admission: EM | Admit: 2021-05-22 | Discharge: 2021-05-22 | Disposition: A | Discharge status: Home / Self Care | Payer: Medicaid Other | Attending: Emergency Medicine | Admitting: Emergency Medicine

## 2021-05-22 ENCOUNTER — Emergency Department (HOSPITAL_COMMUNITY): Payer: Medicaid Other

## 2021-05-22 ENCOUNTER — Encounter (HOSPITAL_COMMUNITY): Payer: Self-pay

## 2021-05-22 DIAGNOSIS — Z20822 Contact with and (suspected) exposure to covid-19: Secondary | ICD-10-CM | POA: Diagnosis not present

## 2021-05-22 DIAGNOSIS — J45909 Unspecified asthma, uncomplicated: Secondary | ICD-10-CM | POA: Diagnosis not present

## 2021-05-22 DIAGNOSIS — F840 Autistic disorder: Secondary | ICD-10-CM | POA: Insufficient documentation

## 2021-05-22 DIAGNOSIS — R0602 Shortness of breath: Secondary | ICD-10-CM | POA: Diagnosis present

## 2021-05-22 HISTORY — DX: Anxiety disorder, unspecified: F41.9

## 2021-05-22 HISTORY — DX: Unspecified asthma, uncomplicated: J45.909

## 2021-05-22 HISTORY — DX: Bipolar disorder, unspecified: F31.9

## 2021-05-22 LAB — RESP PANEL BY RT-PCR (FLU A&B, COVID) ARPGX2
Influenza A by PCR: NEGATIVE
Influenza B by PCR: NEGATIVE
SARS Coronavirus 2 by RT PCR: NEGATIVE

## 2021-05-22 MED ORDER — ALBUTEROL SULFATE HFA 108 (90 BASE) MCG/ACT IN AERS
2.0000 | INHALATION_SPRAY | Freq: Once | RESPIRATORY_TRACT | Status: AC
  Administered 2021-05-22: 2 via RESPIRATORY_TRACT
  Filled 2021-05-22: qty 6.7

## 2021-05-22 NOTE — ED Notes (Signed)
Pt ambulated on a pulse ox. Pt maintained oxygent saturations of 100% while ambulating.

## 2021-05-22 NOTE — Discharge Instructions (Addendum)
You were evaluated in the Emergency Department and after careful evaluation, we did not find any emergent condition requiring admission or further testing in the hospital.  Your chest x-ray today did not show any evidence of pneumonia.  Your COVID test is still pending, you may check the results of this via the MyChart app, there are instructions on your discharge paperwork on how to download this.  Please take the albuterol inhaler as needed.  We encourage you to follow-up with a primary care provider, if you do not have one, please call the phone number in your discharge paperwork in order to establish with one.   Please return to the Emergency Department if you experience any worsening of your condition.  We encourage you to follow up with a primary care provider.  Thank you for allowing [place] to be a part of your care.

## 2021-05-22 NOTE — ED Notes (Signed)
O2 turned off at 95-99% on the monitor.

## 2021-05-22 NOTE — ED Triage Notes (Signed)
Shortness of breath that started today while pt was walking in his apartment. O2 at 92% on RA. Hx of asthma, anxiety. Uses an inhaler but has no access to his inhaler. Pt reports using vape daily. Alert and oriented x 4. EMS initiated 3LPM [place] with 99% on the monitor.

## 2021-05-22 NOTE — ED Provider Notes (Signed)
[facility] EMERGENCY DEPARTMENT Provider Note   CSN: [id number] Arrival date & time: 05/22/21  2671     History Chief Complaint  Patient presents with   Shortness of Breath    Glen Boyle is a 25 y.o. male.  HPI 25 year old male with a history of autism, asthma, anxiety, bipolar disorder presents to the ER with complaints of shortness of breath.  Patient states that he went for a walk this morning, walked about 2 miles when he started to feel more short of breath.  He states he sat down for about 20 minutes but still felt like he could not catch his breath.  He denies any chest pain at the time.  He states that because it took him so long to catch his breath, he called EMS.  Patient denies any leg swelling, no recent travel, history of blood clots.  Denies any dizziness or syncope.  States he does not feel short of breath now.  Denies any fevers, chills, cough.    Past Medical History:  Diagnosis Date   Anxiety    Asthma    Autism spectrum    Bipolar disorder (HCC)    Mitral valve prolapse     There are no problems to display for this patient.   Past Surgical History:  Procedure Laterality Date   MOUTH SURGERY         History reviewed. No pertinent family history.  Social History   Tobacco Use   Smoking status: Never   Smokeless tobacco: Never  Vaping Use   Vaping Use: Every day  Substance Use Topics   Alcohol use: No   Drug use: No    Home Medications Prior to Admission medications   Medication Sig Start Date End Date Taking? Authorizing Provider  ARIPiprazole (ABILIFY) 10 MG tablet Take 10 mg by mouth at bedtime.    [provider]  benztropine (COGENTIN) 2 MG tablet Take 2 mg by mouth at bedtime.    [provider]  bisacodyl (DULCOLAX) 5 MG EC tablet Take 5 mg by mouth daily as needed for constipation.    [provider]  GuanFACINE HCl (INTUNIV) 4 MG TB24 Take 1 tablet by mouth every morning.     [provider]    Allergies    Patient has no known allergies.  Review of Systems   Review of Systems  Constitutional:  Negative for chills and fever.  HENT:  Negative for ear pain and sore throat.   Eyes:  Negative for pain and visual disturbance.  Respiratory:  Positive for shortness of breath. Negative for cough.   Cardiovascular:  Negative for chest pain and palpitations.  Gastrointestinal:  Negative for abdominal pain and vomiting.  Genitourinary:  Negative for dysuria and hematuria.  Musculoskeletal:  Negative for arthralgias and back pain.  Skin:  Negative for color change and rash.  Neurological:  Negative for seizures and syncope.  All other systems reviewed and are negative.  Physical Exam Updated Vital Signs BP 120/74   Pulse 88   Temp 98.3 F (36.8 C) (Oral)   Resp 16   Ht 5\' 8"  (1.727 m)   Wt 74.8 kg   SpO2 95%   BMI 25.09 kg/m   Physical Exam Vitals and nursing note reviewed.  Constitutional:      Appearance: He is well-developed.  HENT:     Head: Normocephalic and atraumatic.  Eyes:     Conjunctiva/sclera: Conjunctivae normal.  Cardiovascular:  Rate and Rhythm: Normal rate and regular rhythm.     Heart sounds: No murmur heard. Pulmonary:     Effort: Pulmonary effort is normal. No tachypnea, accessory muscle usage or respiratory distress.     Breath sounds: No decreased breath sounds, wheezing, rhonchi or rales.  Chest:     Chest wall: No tenderness.  Abdominal:     Palpations: Abdomen is soft.     Tenderness: There is no abdominal tenderness.  Musculoskeletal:     Cervical back: Neck supple.     Right lower leg: No tenderness. No edema.     Left lower leg: No tenderness. No edema.  Skin:    General: Skin is warm and dry.  Neurological:     General: No focal deficit present.     Mental Status: He is alert.  Psychiatric:        Mood and Affect: Mood normal.        Behavior: Behavior normal.    ED Results / Procedures /  Treatments   Labs (all labs ordered are listed, but only abnormal results are displayed) Labs Reviewed  RESP PANEL BY RT-PCR (FLU A&B, COVID) [id number]    EKG None  Radiology DG Chest Portable 1 View  Result Date: 05/22/2021 CLINICAL DATA:  Acute shortness of breath EXAM: PORTABLE CHEST 1 VIEW COMPARISON:  09/06/2019 FINDINGS: This is a low volume film. The cardiomediastinal silhouette is unremarkable. There is no evidence of focal airspace disease, pulmonary edema, suspicious pulmonary nodule/mass, pleural effusion, or pneumothorax. No acute bony abnormalities are identified. Remote RIGHT clavicle fracture identified. IMPRESSION: No active disease. Electronically Signed   By: Harmon Pier M.D.   On: 05/22/2021 10:36    Procedures Procedures   Medications Ordered in ED Medications  albuterol (VENTOLIN HFA) 108 (90 Base) MCG/ACT inhaler 2 puff (2 puffs Inhalation Given 05/22/21 1132)    ED Course  I have reviewed the triage vital signs and the nursing notes.  Pertinent labs & imaging results that were available during my care of the patient were reviewed by me and considered in my medical decision making (see chart for details).    MDM Rules/Calculators/A&P                          25 year old male presents to the ER with an episode of shortness of breath 4 earlier today.  On arrival, he is well-appearing, in no acute distress, resting comfortably in the ER bed.  Vitals overall reassuring, not tachycardic, tachypneic or hypoxic.  He arrived on 3 L nasal cannula, however when this was turned off he remained with sats consistently at 95%.  He was ambulated with pulse ox and saturations remained at 100% as well.  Chest x-ray without any evidence of pneumonia, EKG with no ischemic changes.  Swab sent for COVID.  Low suspicion for PE, ACS, pneumonia, pneumothorax.  No evidence of severe asthma exacerbation.  Patient was given several puffs of albuterol inhaler and given 1 to take home.  Encouraged  PCP follow-up.  Discussed return precautions.  He voiced her standing and is agreeable.  Stable for discharge.  Final Clinical Impression(s) / ED Diagnoses Final diagnoses:  SOB (shortness of breath)    Rx / DC Orders ED Discharge Orders     None        Leone Brand 05/22/21 1135    Ernie Avena, MD 05/22/21 1302

## 2021-12-07 ENCOUNTER — Other Ambulatory Visit: Payer: Self-pay

## 2021-12-07 ENCOUNTER — Emergency Department (HOSPITAL_COMMUNITY)
Admission: EM | Admit: 2021-12-07 | Discharge: 2021-12-07 | Disposition: A | Discharge status: Home / Self Care | Payer: Medicaid Other | Attending: Emergency Medicine | Admitting: Emergency Medicine

## 2021-12-07 ENCOUNTER — Encounter (HOSPITAL_COMMUNITY): Payer: Self-pay | Admitting: Emergency Medicine

## 2021-12-07 DIAGNOSIS — R55 Syncope and collapse: Secondary | ICD-10-CM | POA: Diagnosis not present

## 2021-12-07 LAB — BASIC METABOLIC PANEL
Anion gap: 11 (ref 5–15)
BUN: 9 mg/dL (ref 6–20)
CO2: 23 mmol/L (ref 22–32)
Calcium: 9.5 mg/dL (ref 8.9–10.3)
Chloride: 104 mmol/L (ref 98–111)
Creatinine, Ser: 1.16 mg/dL (ref 0.61–1.24)
GFR, Estimated: >60 mL/min (ref >60)
Glucose, Bld: 95 mg/dL (ref 70–99)
Potassium: 3.6 mmol/L (ref 3.5–5.1)
Sodium: 138 mmol/L (ref 135–145)

## 2021-12-07 LAB — CBC WITH DIFFERENTIAL/PLATELET
Abs Immature Granulocytes: 0.03 10*3/uL (ref 0.00–0.07)
Basophils Absolute: 0.1 10*3/uL (ref 0.0–0.1)
Basophils Relative: 1 %
Eosinophils Absolute: 0.1 10*3/uL (ref 0.0–0.5)
Eosinophils Relative: 1 %
HCT: 44.3 % (ref 39.0–52.0)
Hemoglobin: 14.8 g/dL (ref 13.0–17.0)
Immature Granulocytes: 0 %
Lymphocytes Relative: 35 %
Lymphs Abs: 3.6 10*3/uL (ref 0.7–4.0)
MCH: 29 pg (ref 26.0–34.0)
MCHC: 33.4 g/dL (ref 30.0–36.0)
MCV: 86.7 fL (ref 80.0–100.0)
Monocytes Absolute: 0.8 10*3/uL (ref 0.1–1.0)
Monocytes Relative: 8 %
Neutro Abs: 5.5 10*3/uL (ref 1.7–7.7)
Neutrophils Relative %: 55 %
Platelets: 217 10*3/uL (ref 150–400)
RBC: 5.11 MIL/uL (ref 4.22–5.81)
RDW: 11.8 % (ref 11.5–15.5)
WBC: 10.1 10*3/uL (ref 4.0–10.5)
nRBC: 0 % (ref 0.0–0.2)

## 2021-12-07 NOTE — ED Provider Notes (Signed)
[facility] EMERGENCY DEPARTMENT Provider Note   CSN: [id number] Arrival date & time: 12/07/21  4628     History  Chief Complaint  Patient presents with   Near Syncope    Glen Boyle is a 26 y.o. male.  Patient here after possible syncopal event.  Patient currently wearing a [employer] boy recorder as he has had syncope in the past.  He had recent admission and extensive work-up recently.  States that he is wearing a loop recorder.  He was playing video games today when he states that he could not feel his radial or carotid pulse.  He states that the person he was playing video games with thought he was slightly unresponsive.  He denies in his head.  He is back at his baseline.  Denies any chest pain or shortness of breath.  The history is provided by the patient.  Near Syncope This is a recurrent problem. The current episode started less than 1 hour ago. The problem has been resolved. Pertinent negatives include no chest pain, no abdominal pain, no headaches and no shortness of breath. Nothing aggravates the symptoms. Nothing relieves the symptoms. He has tried nothing for the symptoms. The treatment provided no relief.      Home Medications Prior to Admission medications   Medication Sig Start Date End Date Taking? Authorizing Provider  ARIPiprazole (ABILIFY) 10 MG tablet Take 10 mg by mouth at bedtime.    [provider]  benztropine (COGENTIN) 2 MG tablet Take 2 mg by mouth at bedtime.    [provider]  bisacodyl (DULCOLAX) 5 MG EC tablet Take 5 mg by mouth daily as needed for constipation.    [provider]  GuanFACINE HCl (INTUNIV) 4 MG TB24 Take 1 tablet by mouth every morning.    [provider]      Allergies    Patient has no known allergies.    Review of Systems   Review of Systems  Respiratory:  Negative for shortness of breath.   Cardiovascular:  Positive for near-syncope. Negative for  chest pain.  Gastrointestinal:  Negative for abdominal pain.  Neurological:  Negative for headaches.   Physical Exam Updated Vital Signs BP 108/78    Pulse 76    Temp 97.9 F (36.6 C) (Oral)    Resp 17    SpO2 97%  Physical Exam Vitals and nursing note reviewed.  Constitutional:      General: He is not in acute distress.    Appearance: He is well-developed. He is not ill-appearing.  HENT:     Head: Normocephalic and atraumatic.     Nose: Nose normal.     Mouth/Throat:     Mouth: Mucous membranes are moist.  Eyes:     Extraocular Movements: Extraocular movements intact.     Conjunctiva/sclera: Conjunctivae normal.     Pupils: Pupils are equal, round, and reactive to light.  Cardiovascular:     Rate and Rhythm: Normal rate and regular rhythm.     Pulses: Normal pulses.     Heart sounds: Normal heart sounds. No murmur heard. Pulmonary:     Effort: Pulmonary effort is normal. No respiratory distress.     Breath sounds: Normal breath sounds.  Abdominal:     Palpations: Abdomen is soft.     Tenderness: There is no abdominal tenderness.  Musculoskeletal:        General: No swelling.     Cervical back: Normal range of motion and  neck supple.  Skin:    General: Skin is warm and dry.     Capillary Refill: Capillary refill takes less than 2 seconds.  Neurological:     General: No focal deficit present.     Mental Status: He is alert and oriented to person, place, and time.     Cranial Nerves: No cranial nerve deficit.     Sensory: No sensory deficit.     Motor: No weakness.     Coordination: Coordination normal.     Comments: 5+ out of 5 strength throughout, normal sensation, no drift, normal finger-nose-finger, normal speech  Psychiatric:        Mood and Affect: Mood normal.    ED Results / Procedures / Treatments   Labs (all labs ordered are listed, but only abnormal results are displayed) Labs Reviewed  BASIC METABOLIC PANEL  CBC WITH DIFFERENTIAL/PLATELET     EKG EKG Interpretation  Date/Time:  Tuesday December 07 2021 07:35:22 EST Ventricular Rate:  93 PR Interval:  219 QRS Duration: 93 QT Interval:  340 QTC Calculation: 423 R Axis:   57 Text Interpretation: Sinus rhythm Prolonged PR interval ST elev, probable normal early repol pattern No significant change since last tracing Confirmed by Virgina Norfolk ([id number]) on 12/07/2021 7:40:55 AM  Radiology No results found.  Procedures Procedures    Medications Ordered in ED Medications - No data to display  ED Course/ Medical Decision Making/ A&P                           Medical Decision Making Amount and/or Complexity of Data Reviewed Labs: ordered.   Glen Boyle is here after possible syncopal episode.  Unremarkable vitals.  No fever.  EKG upon arrival per my review interpretation shows sinus rhythm with no ischemic changes.  He has changes suggestive of benign early repolarization.  These are seen in the past as well.  He is not having any chest pain or shortness of breath.  He is overall asymptomatic.  States that he was playing videogames when he states for some reason he could not feel his radial pulse or carotid pulse.  Some when he was playing videogames with thought he was unresponsive slightly as well.  He overall appears well now.  He is neurologically intact.  He states that he is wearing a pacemaker but on chart review he had a loop recorder placed recently.  He had recent admission per my review of his past medical chart where he had CT of his head, MRI and extensive syncope work-up.  He wore a heart monitor for 2 weeks that showed evidence of 2 to 2 second sinus pauses, that is why he is now wearing loop recorder.  This is his first event since wearing the recorder where he has had some symptoms.  We will check basic labs and try to interrogate his [facility] [employer] loop recorder.  I reviewed and interpreted labs which were overall unremarkable.  No significant  anemia, electrolyte abnormality, kidney injury.  Cardiology came down to interpret his loop recorder which showed sinus rhythm.  No significant arrhythmias.  Cleared at this time discharge for continued work-up with outpatient cardiology.  Discharged in good condition.  This chart was dictated using voice recognition software.  Despite best efforts to proofread,  errors can occur which can change the documentation meaning.         Final Clinical Impression(s) / ED Diagnoses Final diagnoses:  Near  syncope    Rx / DC Orders ED Discharge Orders     None         Virgina Norfolk, DO 12/07/21 7062

## 2021-12-07 NOTE — Discharge Instructions (Signed)
Continue to follow-up with your primary care doctor and cardiologist.  You are loop recorder today did not show any abnormal heart rhythms.  Please rest.

## 2021-12-07 NOTE — ED Triage Notes (Signed)
Pt BIB [facility]. Pt was playing video games 3 hrs pta and felt dizzy. Later stated he couldn't feel his radial or carotid pulse. EMS reports good radial and carotid pulse. 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 12 lead unremarkable, NSR.   EMS VS- 132/96, HR 88, RR 16, CBG 102, 99% RA

## 2021-12-07 NOTE — Progress Notes (Signed)
°  Personally interrogated pts lux [facility]) loop recorder)  No pause or bradycardia.   Symptoms episode at around 630 this am associated with NSR and sinus tach just over 100.    Legrand Como [address]" Pablo, PA-C  12/07/2021 9:24 AM

## 2021-12-07 NOTE — ED Notes (Signed)
Pt verbalizes understanding of discharge instructions. Opportunity for questions and answers were provided. Pt discharged from the ED.   ?

## 2022-04-30 ENCOUNTER — Emergency Department (HOSPITAL_COMMUNITY)
Admission: EM | Admit: 2022-04-30 | Discharge: 2022-04-30 | Disposition: A | Discharge status: Home / Self Care | Payer: Medicaid Other | Attending: Emergency Medicine | Admitting: Emergency Medicine

## 2022-04-30 ENCOUNTER — Encounter (HOSPITAL_COMMUNITY): Payer: Self-pay | Admitting: Emergency Medicine

## 2022-04-30 ENCOUNTER — Other Ambulatory Visit: Payer: Self-pay

## 2022-04-30 ENCOUNTER — Emergency Department (HOSPITAL_COMMUNITY): Payer: Medicaid Other

## 2022-04-30 DIAGNOSIS — R61 Generalized hyperhidrosis: Secondary | ICD-10-CM | POA: Diagnosis present

## 2022-04-30 DIAGNOSIS — R55 Syncope and collapse: Secondary | ICD-10-CM | POA: Insufficient documentation

## 2022-04-30 DIAGNOSIS — R0602 Shortness of breath: Secondary | ICD-10-CM | POA: Diagnosis not present

## 2022-04-30 LAB — BASIC METABOLIC PANEL
Anion gap: 10 (ref 5–15)
BUN: 9 mg/dL (ref 6–20)
CO2: 26 mmol/L (ref 22–32)
Calcium: 9.6 mg/dL (ref 8.9–10.3)
Chloride: 103 mmol/L (ref 98–111)
Creatinine, Ser: 1.08 mg/dL (ref 0.61–1.24)
GFR, Estimated: >60 mL/min (ref >60)
Glucose, Bld: 97 mg/dL (ref 70–99)
Potassium: 3.7 mmol/L (ref 3.5–5.1)
Sodium: 139 mmol/L (ref 135–145)

## 2022-04-30 LAB — CBC
HCT: 45.7 % (ref 39.0–52.0)
Hemoglobin: 15.9 g/dL (ref 13.0–17.0)
MCH: 29.6 pg (ref 26.0–34.0)
MCHC: 34.8 g/dL (ref 30.0–36.0)
MCV: 85.1 fL (ref 80.0–100.0)
Platelets: 228 10*3/uL (ref 150–400)
RBC: 5.37 MIL/uL (ref 4.22–5.81)
RDW: 11.8 % (ref 11.5–15.5)
WBC: 11.7 10*3/uL — ABNORMAL HIGH (ref 4.0–10.5)
nRBC: 0 % (ref 0.0–0.2)

## 2022-04-30 LAB — TROPONIN I (HIGH SENSITIVITY)
Troponin I (High Sensitivity): <2 ng/L (ref <18)
Troponin I (High Sensitivity): 3 ng/L (ref <18)

## 2022-04-30 NOTE — ED Triage Notes (Signed)
Patient arrived with EMS from home reports diaphoresis this morning , denies chest pain or SOB . CBG= 104 by EMS .

## 2022-04-30 NOTE — Discharge Instructions (Signed)
Your loop recorder was interrogated and there is no evidence of pauses noted today. Labs and EKG are unchanged from prior. Please call your cardiologist for recheck on Monday.

## 2022-04-30 NOTE — ED Provider Notes (Signed)
[facility] EMERGENCY DEPARTMENT Provider Note   CSN: [id number] Arrival date & time: 04/30/22  0350     History  Chief Complaint  Patient presents with   Diaphoresis    Glen Boyle is a 26 y.o. male.  HPI 26 yo male asd presents with episode of diaphoresis and "feeling funny".  Patient has had work up for syncope.  He is wearing a loop recorder.  Mother is with him and has results from June with multiple sinus pauses of 3-4.7 seconds. He has been followed by cardiology in [place].  She reports that they have been similar episodes in the past and they were giving consideration to pacemaker placement.  No report of chest pain or syncope today.      Home Medications Prior to Admission medications   Medication Sig Start Date End Date Taking? Authorizing Provider  Cyanocobalamin (VITAMIN B-12 IJ) Inject as directed.   Yes [provider]  escitalopram (LEXAPRO) 10 MG tablet Take 10 mg by mouth daily. 04/05/22  Yes [provider]  vitamin B-12 (CYANOCOBALAMIN) 1000 MCG tablet Take 1,000 mcg by mouth daily. 03/29/22 03/29/23 Yes [provider]      Allergies    Patient has no known allergies.    Review of Systems   Review of Systems  Physical Exam Updated Vital Signs BP (!) 119/94   Pulse 64   Temp 98.6 F (37 C) (Oral)   Resp 14   SpO2 98%  Physical Exam Vitals and nursing note reviewed.  Constitutional:      General: He is not in acute distress.    Appearance: Normal appearance.  HENT:     Head: Normocephalic.     Right Ear: External ear normal.     Left Ear: External ear normal.     Nose: Nose normal.     Mouth/Throat:     Pharynx: Oropharynx is clear.  Eyes:     Pupils: Pupils are equal, round, and reactive to light.  Cardiovascular:     Rate and Rhythm: Normal rate and regular rhythm.     Pulses: Normal pulses.  Pulmonary:     Effort: Pulmonary effort is normal.     Breath sounds: Normal breath  sounds.  Abdominal:     General: Abdomen is flat. Bowel sounds are normal.     Palpations: Abdomen is soft.  Musculoskeletal:        General: Normal range of motion.     Cervical back: Normal range of motion.  Skin:    General: Skin is warm and dry.     Capillary Refill: Capillary refill takes less than 2 seconds.  Neurological:     General: No focal deficit present.     Mental Status: He is alert.  Psychiatric:        Mood and Affect: Mood normal.     ED Results / Procedures / Treatments   Labs (all labs ordered are listed, but only abnormal results are displayed) Labs Reviewed  CBC - Abnormal; Notable for the following components:      Result Value   WBC 11.7 (*)    All other components within normal limits  BASIC METABOLIC PANEL  TROPONIN I (HIGH SENSITIVITY)  TROPONIN I (HIGH SENSITIVITY)    EKG EKG Interpretation  Date/Time:  Saturday April 30 2022 07:03:35 EDT Ventricular Rate:  91 PR Interval:  158 QRS Duration: 86 QT Interval:  342 QTC Calculation: 420 R Axis:   10 Text Interpretation:  Normal sinus rhythm diffuse st elevation No significant change since last tracing Anterolateral ST elevation, probable early repolarization Confirmed by Margarita Grizzle [phone]) on 04/30/2022 8:11:19 AM  Radiology DG Chest 2 View  Result Date: 04/30/2022 CLINICAL DATA:  26 year old male with diaphoresis, shortness of breath. History of heart murmur, mitral valve prolapse. EXAM: CHEST - 2 VIEW COMPARISON:  Portable chest 07/22/2021. FINDINGS: Anterior left chest wall cardiac loop recorder versus lead less ICD. Chronically low lung volumes. Normal cardiac size and mediastinal contours. Visualized tracheal air column is within normal limits. Both lungs are clear. No pneumothorax or pleural effusion. Chronic midshaft right clavicle fracture. No acute osseous abnormality identified. Negative visible bowel gas. IMPRESSION: No acute cardiopulmonary abnormality. Left chest wall cardiac loop  recorder versus lead-less ICD. Electronically Signed   By: Odessa Fleming M.D.   On: 04/30/2022 07:41    Procedures Procedures    Medications Ordered in ED Medications - No data to display  ED Course/ Medical Decision Making/ A&P Clinical Course as of 04/30/22 1200  Sat Apr 30, 2022  1044 Chest x-[place] reviewed interpreted evidence of abnormality noted [DR]  1155 Patient with tachycardic event today for 11 seconds at 5:42 AM No pauses noted today [DR]  1157 Discussed with Christa at [facility] scientific.  Patient has had some very short tachycardic events but has not had any pauses since June [DR]    Clinical Course User Index [DR] Margarita Grizzle, MD                           Medical Decision Making 26 year old male with history of syncope who has recorder in place.  Today he felt sweaty and hot.  He woke up from sleep feeling this way.  There is been no syncope or pain associated with this today.  He has had previous pauses known by his cardiologist who is working this up.  In the ED he had labs obtained that are normal.  EKG with diffuse ST elevation unchanged from prior.  We interrogated his loop recorder.  There were no pauses today.  He has had some episodes this month of tacky arrhythmias with very short episodes for approximately 10 seconds.  This does not seem to account for the syncope or the weakness today. Troponin and repeat troponin have been obtained.  Mother is at bedside.  I discussed the results.  They will follow-up with his cardiologist.  Amount and/or Complexity of Data Reviewed External Data Reviewed: labs and ECG. Labs: ordered. Decision-making details documented in ED Course. Radiology: ordered and independent interpretation performed. Decision-making details documented in ED Course. ECG/medicine tests: ordered and independent interpretation performed. Decision-making details documented in ED Course. Discussion of management or test interpretation with external provider(s):  Discussed with Dr. Wyline Mood, on-call for cardiology.          Final Clinical Impression(s) / ED Diagnoses Final diagnoses:  Diaphoresis    Rx / DC Orders ED Discharge Orders     None         Margarita Grizzle, MD 04/30/22 1200

## 2022-05-19 ENCOUNTER — Ambulatory Visit (HOSPITAL_COMMUNITY)
Admission: EM | Admit: 2022-05-19 | Discharge: 2022-05-19 | Disposition: A | Discharge status: Home / Self Care | Payer: Medicaid Other | Attending: Behavioral Health | Admitting: Behavioral Health

## 2022-05-19 DIAGNOSIS — R454 Irritability and anger: Secondary | ICD-10-CM | POA: Insufficient documentation

## 2022-05-19 DIAGNOSIS — F840 Autistic disorder: Secondary | ICD-10-CM | POA: Insufficient documentation

## 2022-05-19 DIAGNOSIS — Z79899 Other long term (current) drug therapy: Secondary | ICD-10-CM | POA: Insufficient documentation
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
## 2022-05-19 NOTE — Discharge Instructions (Addendum)
[facility] has a lot of services for adults such as the Doctor, hospital (ACTT) that you may be eligible for especially since you receive Medicaid.  An Physiological scientist (ACT) team consists of a community-based group of medical, behavioral health and rehabilitation professionals who use a team approach to meet the needs of an individual with severe and persistent mental illness. An individual who is appropriate for ACT does not benefit from receiving services across multiple, disconnected providers and may become at greater risk of hospitalization, homelessness, substance use, victimization and incarceration. An ACT team provides person-centered services addressing the breadth of an individual's needs, helping him or her achieve their personal goals. Thus, a fundamental charge of ACT is to be the first-line (and generally sole provider) of all the services that an individual receiving ACT needs. Being the single point of responsibility necessitates a higher frequency and intensity of community-based contacts and a very low individual-to-staff ratio. Services are flexible; teams offer varying levels of care for all individuals receiving ACT and appropriately adjust service levels given an individual's changing needs over time.  An ACT team assists an individual in advancing toward personal goals with a focus on enhancing community integration and regaining valued roles (example, worker, daughter, resident, spouse, tenant, or friend). Because an ACT team often works with individuals who may passively or actively resist services, an ACT team is expected to thoughtfully carry out planned assertive engagement techniques including rapport-building strategies, facilitating meeting basic needs and motivational interviewing techniques. These techniques are used to identify and focus on the individual's life goals and what he or she is motivated to change. Likewise, it is the team's  responsibility to monitor the individual's mental status and provide needed supports in a manner consistent with the individual's level of need and functioning. The ACT team delivers all services according to a recovery-based philosophy of care. The team promotes self-determination, respects the person receiving ACT as an individual in his or her own right and engages peers in promoting hope that the individual can recover from mental illness and regain meaningful roles and relationships in the community.  You may be eligible for ACT Team services, which would include more frequent visits with your provider, as well as in-home services.  The following providers offer ACT Team services.  Contact them at your earliest opportunity to ask about enrolling in their program:       [facility]      [address]      [place], [place] [address]      [phone] [address]      [place], [place] [address]      [phone]       [facility]      [address]      [place], [place] [address]      [phone]       [facility] ACT Team      [facility] [place] [facility]      [address]      [place], [place]  [address]      [phone]       [facility]      [address]      [place], [place] [address]      [phone]     Autism Services/Providers  The following are clinicians within [facility] who  are supposed to be specialized in working with individuals who have autism, according the [facility] Provider Directory- [email or web address].  Massie Maroon Gagne [address] [place], [place], [address] [phone] phone  Andree Coss [address] [place], [place], [address] [phone] phone  Yvonne Kendall [phone] [place], [place], [address] [phone]  phone  Marguerita Beards [address] [place], [place], [address] [phone] phone  Vickie Lennette Bihari [address] [place], [place], [address] [phone] phone  Marisa Cyphers Cantrell [address] [place], [place], [address] [phone] phone  [facility], [place] [address], [place], [address] [phone] phone  It is extremely important for caregivers to link with support groups to lessen the feeling of being isolated with this and for validation. Below is a support group for caregivers and family who have loved ones with ASD. The Autism Society of [place] can also provide additional resources that would be helpful. This organization does have a camp for children and adolescents with ASD to meet, socialize, and engage in activities together. Also check out where they may be able to also help with placement as well as assessments and therapy.  [facility] for [facility] and Autism Services  [address], [place], [address] [phone] phone Includes: Early Intervention, General Treatment for ASD, Classroom Readiness, Social Skills Groups, and Group Family Training  Autism Society of [facility] for Life Enrichment (ACLE) [address] [place], [place], [address] [phone] phone  [facility] [place] [address] [place], [place], [address] [phone] phone  [facility] [address] [place] [place], [place], [address] [phone] phone  Autism Society of [facility] Find a Chapter/Support Group Chapters and Support Groups provide a place for families who face similar challenges to feel understood as they offer each other encouragement. [email or web address] -RefurbishedBikes.be [email or web address].[email or web address] For more information about events and meetups, please see the calendar, contact the Chapter by  email, or join the Facebook group. [email or web address].autismsociety-[email or web address]   Something else to consider is getting a care coordinator the MCO/LME, [facility].  A care coordinator can help with finding providers that will meet your healthcare needs.  The number for [facility] is [phone].    Also, if you ever want to switch to a different provider for therapy and medication management, here is a list of other providers who take Medicaid:  Based on what you have shared, a list of resources for outpatient therapy and psychiatry is provided below to get you started back on treatment.  It is imperative that you follow through with treatment within 5-7 days from the day of discharge to prevent any further risk to your safety or mental well-being.  You are not limited to the list provided.  In case of an urgent crisis, you may contact the Mobile Crisis Unit with [facility] at [phone].        Outpatient Services for Therapy and Medication Management for [facility]  [facility] [address], [place], [address] [phone] phone   New Patient Assessment/Therapy Walk-Ins:  Monday and Wednesday: 8 am until slots are full. Every 1st and 2nd Fridays of the month: 1 pm - 5 pm.  NO ASSESSMENT/THERAPY WALK-INS ON TUESDAYS OR THURSDAYS  New Patient Assessment/Medication Management Walk-Ins:  Monday - Friday:  8 am - 11 am.  For all walk-ins, we  ask that you arrive by 7:30 am because patients will be seen in the order of arrival.  Availability is limited; therefore, you may not be seen on the same day that you walk-in.  Our goal is to serve and meet the needs of our community to the best of our ability.   [facility] [address] [place], [place], [address] [phone] phone  [facility] [address] [place], [place], [address] [phone] phone  [facility] [address] [place], [place], [address] [phone] phone  [facility] [address], [place], [address] [phone] phone  [facility], [place] [address], [place], [address] [phone] phone  [facility] [address] [place], [place], [address] [phone] phone [phone] fax  [facility] [address] [place], [place], [address] [phone] phone [phone] fax  [facility] [phone] [address] [place], [place], [address] [phone] phone  Jovita Kussmaul [address] [place], [place], [address]  [phone] phone  [facility] [address] [place], [place], [address]  [phone] phone [phone] fax

## 2022-05-19 NOTE — Progress Notes (Signed)
AVS is completed and [facility] entered resources for outpatient therapy and medication management.   Nancee Liter [facility]

## 2022-05-19 NOTE — ED Notes (Signed)
Patient A&O x 4, ambulatory. Patient discharged in no acute distress with caregiver. Patient denied SI/HI, A/VH upon discharge. Patient verbalized understanding of all discharge instructions explained by staff, to include follow up appointments, RX's and safety plan. Patient reported mood 10/10.  Patient escorted to lobby via staff for transport to destination. Safety maintained.

## 2022-05-19 NOTE — BH Assessment (Signed)
LCSW Progress Note   Per Liborio Nixon, NP, this pt does not require psychiatric hospitalization at this time.  Pt is psychiatrically cleared.  Discharge instructions include several resources for Autism services and ACTT.  EDP Liborio Nixon, NP, has been notified.  Hansel Starling, MSW, LCSW [facility] [phone] or [phone]
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
## 2022-05-31 ENCOUNTER — Telehealth (HOSPITAL_COMMUNITY): Payer: Self-pay | Admitting: Internal Medicine

## 2022-05-31 NOTE — BH Assessment (Signed)
Care Management - [facility] Follow Up Discharges   Writer attempted to make contact with patient today and was unsuccessful.  Writer left a HIPPA compliant voice message.   Per chart review, patient was providded with several resources for Autism services and ACTT.

## 2022-06-15 ENCOUNTER — Emergency Department (HOSPITAL_BASED_OUTPATIENT_CLINIC_OR_DEPARTMENT_OTHER)
Admission: EM | Admit: 2022-06-15 | Discharge: 2022-06-15 | Discharge status: Against Medical Advice | Payer: Medicaid Other | Attending: Emergency Medicine | Admitting: Emergency Medicine

## 2022-06-15 ENCOUNTER — Other Ambulatory Visit: Payer: Self-pay

## 2022-06-15 ENCOUNTER — Encounter (HOSPITAL_BASED_OUTPATIENT_CLINIC_OR_DEPARTMENT_OTHER): Payer: Self-pay

## 2022-06-15 DIAGNOSIS — R1010 Upper abdominal pain, unspecified: Secondary | ICD-10-CM | POA: Diagnosis present

## 2022-06-15 DIAGNOSIS — Z5321 Procedure and treatment not carried out due to patient leaving prior to being seen by health care provider: Secondary | ICD-10-CM | POA: Diagnosis not present
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
## 2022-12-29 ENCOUNTER — Emergency Department (HOSPITAL_COMMUNITY)
Admission: EM | Admit: 2022-12-29 | Discharge: 2022-12-30 | Disposition: A | Discharge status: Home / Self Care | Payer: Medicaid Other | Attending: Emergency Medicine | Admitting: Emergency Medicine

## 2022-12-29 ENCOUNTER — Emergency Department (HOSPITAL_COMMUNITY): Payer: Medicaid Other

## 2022-12-29 ENCOUNTER — Encounter (HOSPITAL_COMMUNITY): Payer: Self-pay

## 2022-12-29 DIAGNOSIS — R4182 Altered mental status, unspecified: Secondary | ICD-10-CM | POA: Diagnosis present

## 2022-12-29 DIAGNOSIS — X58XXXA Exposure to other specified factors, initial encounter: Secondary | ICD-10-CM | POA: Insufficient documentation

## 2022-12-29 DIAGNOSIS — E860 Dehydration: Secondary | ICD-10-CM | POA: Diagnosis not present

## 2022-12-29 DIAGNOSIS — F840 Autistic disorder: Secondary | ICD-10-CM | POA: Diagnosis not present

## 2022-12-29 DIAGNOSIS — R109 Unspecified abdominal pain: Secondary | ICD-10-CM | POA: Diagnosis not present

## 2022-12-29 DIAGNOSIS — J45909 Unspecified asthma, uncomplicated: Secondary | ICD-10-CM | POA: Diagnosis not present

## 2022-12-29 DIAGNOSIS — S30811A Abrasion of abdominal wall, initial encounter: Secondary | ICD-10-CM | POA: Insufficient documentation

## 2022-12-29 DIAGNOSIS — F05 Delirium due to known physiological condition: Secondary | ICD-10-CM | POA: Insufficient documentation

## 2022-12-29 DIAGNOSIS — R410 Disorientation, unspecified: Secondary | ICD-10-CM

## 2022-12-29 MED ORDER — SODIUM CHLORIDE 0.9 % IV BOLUS (SEPSIS)
1000.0000 mL | Freq: Once | INTRAVENOUS | Status: AC
  Administered 2022-12-30: 1000 mL via INTRAVENOUS

## 2022-12-29 MED ORDER — TETANUS-DIPHTH-ACELL PERTUSSIS 5-2.5-18.5 LF-MCG/0.5 IM SUSY: 0.5000 mL | PREFILLED_SYRINGE | Freq: Once | INTRAMUSCULAR | Status: DC
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
## 2022-12-30 ENCOUNTER — Encounter (HOSPITAL_COMMUNITY): Payer: Self-pay

## 2022-12-30 ENCOUNTER — Emergency Department (HOSPITAL_COMMUNITY): Payer: Medicaid Other

## 2022-12-30 LAB — CBC WITH DIFFERENTIAL/PLATELET
Abs Immature Granulocytes: 0.12 10*3/uL — ABNORMAL HIGH (ref 0.00–0.07)
Basophils Absolute: 0.1 10*3/uL (ref 0.0–0.1)
Basophils Relative: 1 %
Eosinophils Absolute: 0.3 10*3/uL (ref 0.0–0.5)
Eosinophils Relative: 2 %
HCT: 41.1 % (ref 39.0–52.0)
Hemoglobin: 13.8 g/dL (ref 13.0–17.0)
Immature Granulocytes: 1 %
Lymphocytes Relative: 27 %
Lymphs Abs: 3.3 10*3/uL (ref 0.7–4.0)
MCH: 28.5 pg (ref 26.0–34.0)
MCHC: 33.6 g/dL (ref 30.0–36.0)
MCV: 84.7 fL (ref 80.0–100.0)
Monocytes Absolute: 1.3 10*3/uL — ABNORMAL HIGH (ref 0.1–1.0)
Monocytes Relative: 10 %
Neutro Abs: 7.3 10*3/uL (ref 1.7–7.7)
Neutrophils Relative %: 59 %
Platelets: 216 10*3/uL (ref 150–400)
RBC: 4.85 MIL/uL (ref 4.22–5.81)
RDW: 12.7 % (ref 11.5–15.5)
WBC: 12.2 10*3/uL — ABNORMAL HIGH (ref 4.0–10.5)
nRBC: 0 % (ref 0.0–0.2)

## 2022-12-30 LAB — BLOOD GAS, VENOUS
Acid-Base Excess: 4.4 mmol/L — ABNORMAL HIGH (ref 0.0–2.0)
Bicarbonate: 30.8 mmol/L — ABNORMAL HIGH (ref 20.0–28.0)
O2 Saturation: 77.1 %
Patient temperature: 37
pCO2, Ven: 52 mmHg (ref 44–60)
pH, Ven: 7.38 (ref 7.25–7.43)
pO2, Ven: 45 mmHg (ref 32–45)

## 2022-12-30 LAB — COMPREHENSIVE METABOLIC PANEL
ALT: 23 U/L (ref 0–44)
AST: 27 U/L (ref 15–41)
Albumin: 4.3 g/dL (ref 3.5–5.0)
Alkaline Phosphatase: 62 U/L (ref 38–126)
Anion gap: 11 (ref 5–15)
BUN: 13 mg/dL (ref 6–20)
CO2: 25 mmol/L (ref 22–32)
Calcium: 9.1 mg/dL (ref 8.9–10.3)
Chloride: 102 mmol/L (ref 98–111)
Creatinine, Ser: 1.4 mg/dL — ABNORMAL HIGH (ref 0.61–1.24)
GFR, Estimated: >60 mL/min (ref >60)
Glucose, Bld: 119 mg/dL — ABNORMAL HIGH (ref 70–99)
Potassium: 3.7 mmol/L (ref 3.5–5.1)
Sodium: 138 mmol/L (ref 135–145)
Total Bilirubin: 0.5 mg/dL (ref 0.3–1.2)
Total Protein: 7.2 g/dL (ref 6.5–8.1)

## 2022-12-30 LAB — AMMONIA: Ammonia: 33 umol/L (ref 9–35)

## 2022-12-30 LAB — CK: Total CK: 521 U/L — ABNORMAL HIGH (ref 49–397)

## 2022-12-30 LAB — ETHANOL: Alcohol, Ethyl (B): <10 mg/dL (ref <10)

## 2022-12-30 MED ORDER — SODIUM CHLORIDE (PF) 0.9 % IJ SOLN
INTRAMUSCULAR | Status: AC
  Filled 2022-12-30: qty 50

## 2022-12-30 MED ORDER — SODIUM CHLORIDE 0.9 % IV BOLUS (SEPSIS)
1000.0000 mL | Freq: Once | INTRAVENOUS | Status: AC
  Administered 2022-12-30: 1000 mL via INTRAVENOUS

## 2022-12-30 MED ORDER — HALOPERIDOL LACTATE 5 MG/ML IJ SOLN
5.0000 mg | Freq: Once | INTRAMUSCULAR | Status: AC
  Administered 2022-12-30: 5 mg via INTRAMUSCULAR
  Filled 2022-12-30: qty 1

## 2022-12-30 MED ORDER — IOHEXOL 300 MG/ML  SOLN
100.0000 mL | Freq: Once | INTRAMUSCULAR | Status: AC | PRN
  Administered 2022-12-30: 100 mL via INTRAVENOUS

## 2022-12-30 NOTE — ED Notes (Signed)
Pt got up out of bed and attempted to wander multiple times. Pt then was able to be redirected back to the bed. Pt will not keep monitoring equipment attached at this time.

## 2022-12-30 NOTE — ED Provider Notes (Signed)
[place] EMERGENCY DEPARTMENT AT [facility] Provider Note   CSN: [id number] Arrival date & time: 12/29/22  2329     History  Chief Complaint  Patient presents with   Altered Mental Status   Level 5 caveat due to altered mental status Glen Boyle is a 27 y.o. male.  The history is provided by the police.      Patient with history of autism presents with police and EMS.  Initially the patient who lives alone called 911 due to a seizure.  On their arrival, patient was acting agitated but no seizures were witnessed.  Patient apparently was rolling around in the grass and became combative.  Patient was placed in restraints and given medications for sedation.  No other history is known  Past Medical History:  Diagnosis Date   Anxiety    Asthma    Autism spectrum    Bipolar disorder ([place])    Mitral valve prolapse     Home Medications Prior to Admission medications   Medication Sig Start Date End Date Taking? Authorizing Provider  Cyanocobalamin (VITAMIN B-12 IJ) Inject as directed.    [provider]  escitalopram (LEXAPRO) 10 MG tablet Take 10 mg by mouth daily. 04/05/22   [provider]  vitamin B-12 (CYANOCOBALAMIN) 1000 MCG tablet Take 1,000 mcg by mouth daily. 03/29/22 03/29/23  [provider]      Allergies    Patient has no known allergies.    Review of Systems   Review of Systems  Unable to perform ROS: Mental status change    Physical Exam Updated Vital Signs BP 120/74   Pulse 96   Temp (!) 96.9 F (36.1 C) (Oral)   Resp 12   Ht 1.727 m (5\' 8" )   Wt 75 kg   SpO2 92%   BMI 25.14 kg/m  Physical Exam CONSTITUTIONAL: Disheveled and somnolent HEAD: Normocephalic/atraumatic EYES: EOMI/PERRL, forcibly keeps eyes closed, no nystagmus, pupils are equal ENMT: Mucous membranes moist NECK: supple no meningeal signs SPINE/BACK: No bruising/crepitance/stepoffs noted to spine CV: S1/S2 noted, tachycardic LUNGS:  Lungs are clear to auscultation bilaterally, no apparent distress ABDOMEN: soft, nondistended GU: Large abrasion noted throughout the right flank NEURO: Pt is somnolent but arousable, moves all extremities x 4 EXTREMITIES: pulses normal/equal, full ROM, no obvious deformities SKIN: warm, color normal, scattered abrasions throughout the body  ED Results / Procedures / Treatments   Labs (all labs ordered are listed, but only abnormal results are displayed) Labs Reviewed  COMPREHENSIVE METABOLIC PANEL - Abnormal; Notable for the following components:      Result Value   Glucose, Bld 119 (*)    Creatinine, Ser 1.40 (*)    All other components within normal limits  CBC WITH DIFFERENTIAL/PLATELET - Abnormal; Notable for the following components:   WBC 12.2 (*)    Monocytes Absolute 1.3 (*)    Abs Immature Granulocytes 0.12 (*)    All other components within normal limits  BLOOD GAS, VENOUS - Abnormal; Notable for the following components:   Bicarbonate 30.8 (*)    Acid-Base Excess 4.4 (*)    All other components within normal limits  CK - Abnormal; Notable for the following components:   Total CK 521 (*)    All other components within normal limits  AMMONIA  ETHANOL  RAPID URINE DRUG SCREEN, HOSP PERFORMED    EKG EKG Interpretation  Date/Time:  Friday December 30 2022 00:36:16 EDT Ventricular Rate:  120 PR Interval:  151  QRS Duration: 93 QT Interval:  327 QTC Calculation: 462 R Axis:   53 Text Interpretation: Sinus tachycardia Baseline wander in lead(s) V2 V5 No significant change since last tracing Confirmed by Ripley Fraise [phone]) on 12/30/2022 12:46:44 AM  Radiology CT ABDOMEN PELVIS W CONTRAST  Result Date: 12/30/2022 CLINICAL DATA:  27 year old male with unwitnessed seizure activity. Combative with police. History of autism, asthma. Blunt flank abrasion EXAM: CT CHEST, ABDOMEN AND PELVIS WITHOUT CONTRAST TECHNIQUE: Multidetector CT imaging of the chest, abdomen and  pelvis was performed following the standard protocol without IV contrast. RADIATION DOSE REDUCTION: This exam was performed according to the departmental dose-optimization program which includes automated exposure control, adjustment of the mA and/or kV according to patient size and/or use of iterative reconstruction technique. COMPARISON:  None Available. FINDINGS: CT CHEST FINDINGS Cardiovascular: No significant vascular findings. Normal heart size. No pericardial effusion. Mediastinum/Nodes: No enlarged mediastinal, hilar, or axillary lymph nodes. Thyroid gland, trachea, and esophagus demonstrate no significant findings. Small hiatal hernia. Lungs/Pleura: No focal consolidation, pleural effusion, or pneumothorax. Musculoskeletal: No rib fractures. CT ABDOMEN PELVIS FINDINGS Hepatobiliary: No suspicious focal liver abnormality is seen. No gallstones, gallbladder wall thickening, or biliary dilatation. Pancreas: Unremarkable. No pancreatic ductal dilatation or surrounding inflammatory changes. Spleen: Normal in size without focal abnormality. Adrenals/Urinary Tract: Adrenal glands are unremarkable. Nonobstructing bilateral renal calculi. No hydronephrosis. Bladder is unremarkable. Stomach/Bowel: Stomach is within normal limits. The appendix is normal. No evidence of bowel wall thickening, distention, or inflammatory changes. Vascular/Lymphatic: No significant vascular findings are present. No enlarged abdominal or pelvic lymph nodes. Reproductive: Unremarkable. Other: No free intraperitoneal fluid or air. Musculoskeletal: No acute or significant osseous findings. IMPRESSION: 1. No evidence of acute traumatic injury in the chest, abdomen, or pelvis. 2. Nonobstructing bilateral renal calculi. Electronically Signed   By: Placido Sou M.D.   On: 12/30/2022 03:47   CT Chest W Contrast  Result Date: 12/30/2022 CLINICAL DATA:  27 year old male with unwitnessed seizure activity. Combative with police. History of  autism, asthma. Blunt flank abrasion EXAM: CT CHEST, ABDOMEN AND PELVIS WITHOUT CONTRAST TECHNIQUE: Multidetector CT imaging of the chest, abdomen and pelvis was performed following the standard protocol without IV contrast. RADIATION DOSE REDUCTION: This exam was performed according to the departmental dose-optimization program which includes automated exposure control, adjustment of the mA and/or kV according to patient size and/or use of iterative reconstruction technique. COMPARISON:  None Available. FINDINGS: CT CHEST FINDINGS Cardiovascular: No significant vascular findings. Normal heart size. No pericardial effusion. Mediastinum/Nodes: No enlarged mediastinal, hilar, or axillary lymph nodes. Thyroid gland, trachea, and esophagus demonstrate no significant findings. Small hiatal hernia. Lungs/Pleura: No focal consolidation, pleural effusion, or pneumothorax. Musculoskeletal: No rib fractures. CT ABDOMEN PELVIS FINDINGS Hepatobiliary: No suspicious focal liver abnormality is seen. No gallstones, gallbladder wall thickening, or biliary dilatation. Pancreas: Unremarkable. No pancreatic ductal dilatation or surrounding inflammatory changes. Spleen: Normal in size without focal abnormality. Adrenals/Urinary Tract: Adrenal glands are unremarkable. Nonobstructing bilateral renal calculi. No hydronephrosis. Bladder is unremarkable. Stomach/Bowel: Stomach is within normal limits. The appendix is normal. No evidence of bowel wall thickening, distention, or inflammatory changes. Vascular/Lymphatic: No significant vascular findings are present. No enlarged abdominal or pelvic lymph nodes. Reproductive: Unremarkable. Other: No free intraperitoneal fluid or air. Musculoskeletal: No acute or significant osseous findings. IMPRESSION: 1. No evidence of acute traumatic injury in the chest, abdomen, or pelvis. 2. Nonobstructing bilateral renal calculi. Electronically Signed   By: Placido Sou M.D.   On: 12/30/2022 03:47  CT Head Wo Contrast  Result Date: 12/30/2022 CLINICAL DATA:  Unwitnessed seizure activity and delirium. EXAM: CT HEAD WITHOUT CONTRAST TECHNIQUE: Contiguous axial images were obtained from the base of the skull through the vertex without intravenous contrast. RADIATION DOSE REDUCTION: This exam was performed according to the departmental dose-optimization program which includes automated exposure control, adjustment of the mA and/or kV according to patient size and/or use of iterative reconstruction technique. COMPARISON:  CT head 04/14/2021 FINDINGS: Brain: No intracranial hemorrhage, mass effect, or evidence of acute infarct. No hydrocephalus. No extra-axial fluid collection. Vascular: No hyperdense vessel or unexpected calcification. Skull: No fracture or focal lesion. Sinuses/Orbits: No acute finding. Mucosal thickening in the ethmoid air cells, left-greater-than-right frontal sinuses, and sphenoid sinuses. Near-complete opacification of the right maxillary sinus. Mucosal thickening left maxillary sinus. The mastoid air cells are well aerated. Leftward gaze. Other: None. IMPRESSION: 1. No acute intracranial abnormality. 2. Paranasal sinus mucosal thickening, as described. Correlate for sinusitis. Electronically Signed   By: Placido Sou M.D.   On: 12/30/2022 03:40   DG Chest Port 1 View  Result Date: 12/30/2022 CLINICAL DATA:  Altered mental status EXAM: PORTABLE CHEST 1 VIEW COMPARISON:  04/30/2022 FINDINGS: Low lung volumes and lordotic positioning accentuates the cardiomediastinal silhouette. There is central bronchovascular crowding. No acute airspace disease or effusion. No pneumothorax. Old right clavicle fracture. Electronic device at the left chest wall IMPRESSION: Low lung volumes with central bronchovascular crowding. No acute airspace disease. Electronically Signed   By: Donavan Foil M.D.   On: 12/30/2022 00:04    Procedures .Critical Care  Performed by: Ripley Fraise,  MD Authorized by: Ripley Fraise, MD   Critical care provider statement:    Critical care time (minutes):  75   Critical care start time:  12/30/2022 12:30 AM   Critical care end time:  12/30/2022 1:45 AM   Critical care was necessary to treat or prevent imminent or life-threatening deterioration of the following conditions:  CNS failure or compromise, toxidrome and dehydration   Critical care was time spent personally by me on the following activities:  Obtaining history from patient or surrogate, examination of patient, development of treatment plan with patient or surrogate, discussions with consultants, evaluation of patient's response to treatment, review of old charts, pulse oximetry, re-evaluation of patient's condition, ordering and review of radiographic studies, ordering and review of laboratory studies and ordering and performing treatments and interventions   I assumed direction of critical care for this patient from another provider in my specialty: no       Medications Ordered in ED Medications  Tdap (BOOSTRIX) injection 0.5 mL (has no administration in time range)  sodium chloride (PF) 0.9 % injection (has no administration in time range)  sodium chloride 0.9 % bolus 1,000 mL (1,000 mLs Intravenous New Bag/Given 12/30/22 0149)  haloperidol lactate (HALDOL) injection 5 mg (5 mg Intramuscular Given 12/30/22 0030)  sodium chloride 0.9 % bolus 1,000 mL (1,000 mLs Intravenous New Bag/Given 12/30/22 0148)  iohexol (OMNIPAQUE) 300 MG/ML solution 100 mL (100 mLs Intravenous Contrast Given 12/30/22 0326)    ED Course/ Medical Decision Making/ A&P Clinical Course as of 12/30/22 0537  Fri Dec 30, 2022  0028 Patient initially reported he had seizures, but on their arrival EMS reports he was combative and rolling around the grass.  On my evaluation, he was altered but this was due to sedation.  He was mildly hypoxic.  He has abrasions throughout his body.  Trauma imaging will be ordered.  Patient is now starting to wake up become combative.  Haldol has been ordered [DW]  0106 Creatinine(!): 1.40 Renal insufficiency [DW]  0106 Patient now more alert.  He reports he ate a gummy prior to this episode.  He is still tachycardic.  Workup pending [DW]  [phone] Patient has dehydration, but no other significant injuries or lab abnormalities.  Patient resting comfortably.  Mother is at bedside.  We suspect his behavior was due to the gummy which she says he is not used to having.  Once patient is awake and reassess, he will likely be discharged home [DW]  0501 Mother phone number - [phone] [DW]  [phone] Patient is feeling improved.  He is ambulatory.  His vital signs have improved.  He reports he took the gummy to help him relax, but he became agitated.  At this point he is safe for discharge home.  Mother is coming back to the hospital to take him home. [DW]    Clinical Course User Index [DW] Ripley Fraise, MD                             Medical Decision Making Amount and/or Complexity of Data Reviewed Labs: ordered. Decision-making details documented in ED Course. Radiology: ordered. ECG/medicine tests: ordered.  Risk Prescription drug management.   This patient presents to the ED for concern of altered mental status, this involves an extensive number of treatment options, and is a complaint that carries with it a high risk of complications and morbidity.  The differential diagnosis includes but is not limited to CVA, intracranial hemorrhage, acute coronary syndrome, renal failure, urinary tract infection, electrolyte disturbance, pneumonia, subdural hematoma, toxidrome    Comorbidities that complicate the patient evaluation: Patient's presentation is complicated by their history of autism  Social Determinants of Health: Patient's  history of autism   increases the complexity of managing their presentation  Additional history obtained: Additional history obtained from   police Records reviewed Care Everywhere/External Records  Lab Tests: I Ordered, and personally interpreted labs.  The pertinent results include: leukocytosis  Imaging Studies ordered: I ordered imaging studies including CT scan trauma imaging and X-ray chest   I independently visualized and interpreted imaging which showed no acute injuries I agree with the radiologist interpretation  Cardiac Monitoring: The patient was maintained on a cardiac monitor.  I personally viewed and interpreted the cardiac monitor which showed an underlying rhythm of:  sinus tachycardia  Medicines ordered and prescription drug management: I ordered medication including haldol  for agitation  Reevaluation of the patient after these medicines showed that the patient    improved   Critical Interventions:  meds for agitation, IV fluids  Reevaluation: After the interventions noted above, I reevaluated the patient and found that they have :improved  Complexity of problems addressed: Patient's presentation is most consistent with  acute presentation with potential threat to life or bodily function  Disposition: After consideration of the diagnostic results and the patient's response to treatment,  I feel that the patent would benefit from discharge   .           Final Clinical Impression(s) / ED Diagnoses Final diagnoses:  Delirium  Dehydration    Rx / DC Orders ED Discharge Orders     None         Ripley Fraise, MD 12/30/22 [phone]

## 2022-12-30 NOTE — Discharge Instructions (Signed)

## 2023-03-13 ENCOUNTER — Other Ambulatory Visit: Payer: Self-pay

## 2023-03-13 ENCOUNTER — Emergency Department (HOSPITAL_COMMUNITY)
Admission: EM | Admit: 2023-03-13 | Discharge: 2023-03-13 | Disposition: A | Discharge status: Home / Self Care | Payer: Medicaid Other | Attending: Emergency Medicine | Admitting: Emergency Medicine

## 2023-03-13 ENCOUNTER — Encounter (HOSPITAL_COMMUNITY): Payer: Self-pay | Admitting: Emergency Medicine

## 2023-03-13 DIAGNOSIS — D72829 Elevated white blood cell count, unspecified: Secondary | ICD-10-CM | POA: Diagnosis not present

## 2023-03-13 DIAGNOSIS — R531 Weakness: Secondary | ICD-10-CM | POA: Diagnosis present

## 2023-03-13 DIAGNOSIS — T675XXA Heat exhaustion, unspecified, initial encounter: Secondary | ICD-10-CM | POA: Insufficient documentation

## 2023-03-13 LAB — CBC WITH DIFFERENTIAL/PLATELET
Abs Immature Granulocytes: 0.05 10*3/uL (ref 0.00–0.07)
Basophils Absolute: 0.1 10*3/uL (ref 0.0–0.1)
Basophils Relative: 1 %
Eosinophils Absolute: 0.1 10*3/uL (ref 0.0–0.5)
Eosinophils Relative: 1 %
HCT: 44.2 % (ref 39.0–52.0)
Hemoglobin: 15.1 g/dL (ref 13.0–17.0)
Immature Granulocytes: 0 %
Lymphocytes Relative: 17 %
Lymphs Abs: 2.2 10*3/uL (ref 0.7–4.0)
MCH: 29 pg (ref 26.0–34.0)
MCHC: 34.2 g/dL (ref 30.0–36.0)
MCV: 85 fL (ref 80.0–100.0)
Monocytes Absolute: 1 10*3/uL (ref 0.1–1.0)
Monocytes Relative: 8 %
Neutro Abs: 9.3 10*3/uL — ABNORMAL HIGH (ref 1.7–7.7)
Neutrophils Relative %: 73 %
Platelets: 222 10*3/uL (ref 150–400)
RBC: 5.2 MIL/uL (ref 4.22–5.81)
RDW: 12.1 % (ref 11.5–15.5)
WBC: 12.6 10*3/uL — ABNORMAL HIGH (ref 4.0–10.5)
nRBC: 0 % (ref 0.0–0.2)

## 2023-03-13 LAB — COMPREHENSIVE METABOLIC PANEL
ALT: 19 U/L (ref 0–44)
AST: 22 U/L (ref 15–41)
Albumin: 4.3 g/dL (ref 3.5–5.0)
Alkaline Phosphatase: 70 U/L (ref 38–126)
Anion gap: 11 (ref 5–15)
BUN: 12 mg/dL (ref 6–20)
CO2: 24 mmol/L (ref 22–32)
Calcium: 9.5 mg/dL (ref 8.9–10.3)
Chloride: 101 mmol/L (ref 98–111)
Creatinine, Ser: 1.07 mg/dL (ref 0.61–1.24)
GFR, Estimated: >60 mL/min (ref >60)
Glucose, Bld: 134 mg/dL — ABNORMAL HIGH (ref 70–99)
Potassium: 3.8 mmol/L (ref 3.5–5.1)
Sodium: 136 mmol/L (ref 135–145)
Total Bilirubin: 0.5 mg/dL (ref 0.3–1.2)
Total Protein: 7.7 g/dL (ref 6.5–8.1)

## 2023-03-13 LAB — TROPONIN I (HIGH SENSITIVITY): Troponin I (High Sensitivity): <2 ng/L (ref <18)

## 2023-03-13 LAB — CK: Total CK: 139 U/L (ref 49–397)

## 2023-03-13 MED ORDER — SODIUM CHLORIDE 0.9 % IV BOLUS: 1000.0000 mL | Freq: Once | INTRAVENOUS | Status: DC

## 2023-03-13 MED ORDER — SODIUM CHLORIDE 0.9 % IV BOLUS
1000.0000 mL | Freq: Once | INTRAVENOUS | Status: AC
  Administered 2023-03-13: 1000 mL via INTRAVENOUS

## 2023-03-13 NOTE — ED Provider Notes (Signed)
[place] EMERGENCY DEPARTMENT AT [facility] Provider Note   CSN: [id number] Arrival date & time: 03/13/23  1823     History  Chief Complaint  Patient presents with   Weakness    Glen Boyle is a 27 y.o. male.  Patient with bipolar disorder presenting with "being overheated".  States he was walking outside for about 25 minutes and felt hot so he called EMS.  States he is feeling better now.  Denies any heavy exertion but just normal walking.  Did not have any chest pain, shortness of breath, nausea, vomiting, dizziness or lightheadedness.  No headache.  No abdominal pain.  No pain with urination or blood in the urine.  Denies any alcohol or drug use.  States compliance with his Lexapro. States he felt a little bit lightheaded but is now feeling better.  Believes he was overheated from walking outside for about 20 minutes.  Denies any room spinning.  The history is provided by the patient and the EMS personnel.  Weakness Associated symptoms: no abdominal pain, no arthralgias, no chest pain, no cough, no dizziness, no dysuria, no fever, no myalgias, no nausea, no shortness of breath and no vomiting        Home Medications Prior to Admission medications   Medication Sig Start Date End Date Taking? Authorizing Provider  Cyanocobalamin (VITAMIN B-12 IJ) Inject as directed.    [provider]  escitalopram (LEXAPRO) 10 MG tablet Take 10 mg by mouth daily. 04/05/22   [provider]  vitamin B-12 (CYANOCOBALAMIN) 1000 MCG tablet Take 1,000 mcg by mouth daily. 03/29/22 03/29/23  [provider]      Allergies    Patient has no known allergies.    Review of Systems   Review of Systems  Constitutional:  Positive for fatigue. Negative for activity change, appetite change and fever.  HENT:  Negative for congestion and rhinorrhea.   Respiratory:  Negative for cough, chest tightness and shortness of breath.   Cardiovascular:  Negative for  chest pain.  Gastrointestinal:  Negative for abdominal pain, nausea and vomiting.  Genitourinary:  Negative for dysuria and hematuria.  Musculoskeletal:  Negative for arthralgias and myalgias.  Skin:  Negative for rash.  Neurological:  Positive for weakness and light-headedness. Negative for dizziness.   all other systems are negative except as noted in the HPI and PMH.    Physical Exam Updated Vital Signs BP 104/70   Pulse (!) 109   Temp 97.9 F (36.6 C) (Oral)   Resp 18   SpO2 94%  Physical Exam Vitals and nursing note reviewed.  Constitutional:      General: He is not in acute distress.    Appearance: He is well-developed.  HENT:     Head: Normocephalic and atraumatic.     Mouth/Throat:     Pharynx: No oropharyngeal exudate.  Eyes:     Conjunctiva/sclera: Conjunctivae normal.     Pupils: Pupils are equal, round, and reactive to light.  Neck:     Comments: No meningismus. Cardiovascular:     Rate and Rhythm: Regular rhythm. Tachycardia present.     Heart sounds: Normal heart sounds. No murmur heard. Pulmonary:     Effort: Pulmonary effort is normal. No respiratory distress.     Breath sounds: Normal breath sounds.  Abdominal:     Palpations: Abdomen is soft.     Tenderness: There is no abdominal tenderness. There is no guarding or rebound.  Musculoskeletal:  General: No tenderness. Normal range of motion.     Cervical back: Normal range of motion and neck supple.  Skin:    General: Skin is warm.     Capillary Refill: Capillary refill takes less than 2 seconds.  Neurological:     General: No focal deficit present.     Mental Status: He is alert and oriented to person, place, and time. Mental status is at baseline.     Cranial Nerves: No cranial nerve deficit.     Motor: No abnormal muscle tone.     Coordination: Coordination normal.     Comments:  5/5 strength throughout. CN 2-12 intact.Equal grip strength.   Psychiatric:        Behavior: Behavior normal.      ED Results / Procedures / Treatments   Labs (all labs ordered are listed, but only abnormal results are displayed) Labs Reviewed  CBC WITH DIFFERENTIAL/PLATELET - Abnormal; Notable for the following components:      Result Value   WBC 12.6 (*)    Neutro Abs 9.3 (*)    All other components within normal limits  COMPREHENSIVE METABOLIC PANEL - Abnormal; Notable for the following components:   Glucose, Bld 134 (*)    All other components within normal limits  CK  TROPONIN I (HIGH SENSITIVITY)    EKG EKG Interpretation  Date/Time:  Monday Mar 13 2023 18:48:18 EDT Ventricular Rate:  110 PR Interval:  164 QRS Duration: 97 QT Interval:  335 QTC Calculation: 454 R Axis:   59 Text Interpretation: Sinus tachycardia ST elevation, consider inferior injury No significant change was found Confirmed by Glynn Octave [phone]) on 03/13/2023 6:50:34 PM  Radiology No results found.  Procedures Procedures    Medications Ordered in ED Medications  sodium chloride 0.9 % bolus 1,000 mL (has no administration in time range)    ED Course/ Medical Decision Making/ A&P                             Medical Decision Making Amount and/or Complexity of Data Reviewed Independent Historian: EMS Labs: ordered. Decision-making details documented in ED Course. Radiology: ordered and independent interpretation performed. Decision-making details documented in ED Course. ECG/medicine tests: ordered. Decision-making details documented in ED Course.   Patient feeling hot after walking outside.  He is tachycardic.  Denies any other symptoms.  No focal neurological deficits  IVF given given his complaints of feeling weak and dehydrated.  Heart rate is elevated on arrival.  Labs are reassuring.  No electrolyte abnormalities.  CK is normal.  Chronic leukocytosis noted.  Heart rate has improved to 100.  Patient tolerating p.o. and ambulatory.  He is stable for discharge.  Patient tolerating p.o.  and ambulatory.  Encouraged to keep his self hydrated in the hot weather.  Return to the ED with new or worsening symptoms.        Final Clinical Impression(s) / ED Diagnoses Final diagnoses:  None    Rx / DC Orders ED Discharge Orders     None         Tabias Swayze, Jeannett Senior, MD 03/13/23 1940

## 2023-03-13 NOTE — Discharge Instructions (Signed)
Keep yourself hydrated.  Follow-up with your primary doctor.  Return to the ED with difficulty breathing, chest pain, shortness of breath, dizziness, confusion or any other concerns.

## 2023-03-13 NOTE — ED Triage Notes (Signed)
Patient BIB EMS from home c/o weakness. Pt report weakness after walking 10 laps in his apartment. Pt denies N/V. Pt a/ox4.

## 2023-05-21 ENCOUNTER — Other Ambulatory Visit: Payer: Self-pay

## 2023-05-21 ENCOUNTER — Emergency Department (HOSPITAL_COMMUNITY)
Admission: EM | Admit: 2023-05-21 | Discharge: 2023-05-21 | Disposition: A | Discharge status: Home / Self Care | Payer: MEDICAID | Attending: Emergency Medicine | Admitting: Emergency Medicine

## 2023-05-21 ENCOUNTER — Encounter (HOSPITAL_COMMUNITY): Payer: Self-pay | Admitting: Emergency Medicine

## 2023-05-21 DIAGNOSIS — F840 Autistic disorder: Secondary | ICD-10-CM | POA: Diagnosis not present

## 2023-05-21 DIAGNOSIS — R42 Dizziness and giddiness: Secondary | ICD-10-CM | POA: Insufficient documentation

## 2023-05-21 LAB — CBC WITH DIFFERENTIAL/PLATELET
Abs Immature Granulocytes: 0.04 10*3/uL (ref 0.00–0.07)
Basophils Absolute: 0.1 10*3/uL (ref 0.0–0.1)
Basophils Relative: 1 %
Eosinophils Absolute: 0.1 10*3/uL (ref 0.0–0.5)
Eosinophils Relative: 1 %
HCT: 39 % (ref 39.0–52.0)
Hemoglobin: 13.1 g/dL (ref 13.0–17.0)
Immature Granulocytes: 0 %
Lymphocytes Relative: 28 %
Lymphs Abs: 2.7 10*3/uL (ref 0.7–4.0)
MCH: 28.4 pg (ref 26.0–34.0)
MCHC: 33.6 g/dL (ref 30.0–36.0)
MCV: 84.6 fL (ref 80.0–100.0)
Monocytes Absolute: 1 10*3/uL (ref 0.1–1.0)
Monocytes Relative: 10 %
Neutro Abs: 5.8 10*3/uL (ref 1.7–7.7)
Neutrophils Relative %: 60 %
Platelets: 202 10*3/uL (ref 150–400)
RBC: 4.61 MIL/uL (ref 4.22–5.81)
RDW: 12.1 % (ref 11.5–15.5)
WBC: 9.7 10*3/uL (ref 4.0–10.5)
nRBC: 0 % (ref 0.0–0.2)

## 2023-05-21 LAB — COMPREHENSIVE METABOLIC PANEL
ALT: 14 U/L (ref 0–44)
AST: 19 U/L (ref 15–41)
Albumin: 3.4 g/dL — ABNORMAL LOW (ref 3.5–5.0)
Alkaline Phosphatase: 53 U/L (ref 38–126)
Anion gap: 8 (ref 5–15)
BUN: 13 mg/dL (ref 6–20)
CO2: 22 mmol/L (ref 22–32)
Calcium: 7.9 mg/dL — ABNORMAL LOW (ref 8.9–10.3)
Chloride: 109 mmol/L (ref 98–111)
Creatinine, Ser: 1 mg/dL (ref 0.61–1.24)
GFR, Estimated: >60 mL/min (ref >60)
Glucose, Bld: 86 mg/dL (ref 70–99)
Potassium: 3.3 mmol/L — ABNORMAL LOW (ref 3.5–5.1)
Sodium: 139 mmol/L (ref 135–145)
Total Bilirubin: 0.3 mg/dL (ref 0.3–1.2)
Total Protein: 6.3 g/dL — ABNORMAL LOW (ref 6.5–8.1)

## 2023-05-21 LAB — I-STAT CG4 LACTIC ACID, ED: Lactic Acid, Venous: 1.1 mmol/L (ref 0.5–1.9)

## 2023-05-21 LAB — ETHANOL: Alcohol, Ethyl (B): <10 mg/dL (ref <10)

## 2023-05-21 MED ORDER — SODIUM CHLORIDE 0.9 % IV BOLUS
1000.0000 mL | Freq: Once | INTRAVENOUS | Status: AC
  Administered 2023-05-21: 1000 mL via INTRAVENOUS

## 2023-05-21 NOTE — Discharge Instructions (Addendum)
As discussed, your evaluation today has been largely reassuring.  But, it is important that you monitor your condition carefully, and do not hesitate to return to the ED if you develop new, or concerning changes in your condition. ? ?Otherwise, please follow-up with your physician for appropriate ongoing care. ? ?

## 2023-05-21 NOTE — ED Notes (Signed)
Pt called neighbor who is on the way to pick the pt up.

## 2023-05-21 NOTE — ED Triage Notes (Signed)
Patient arrived after feeling dizzy and headache while playing a game. States he felt like he was going to have a seizure. Heart rate irregular with EMS.

## 2023-05-21 NOTE — ED Provider Notes (Signed)
[facility] Provider Note   CSN: [id number] Arrival date & time: 05/21/23  2119     History  Chief Complaint  Patient presents with   Dizziness    Glen Boyle is a 27 y.o. male.  HPI Patient presents via EMS with concern for seizure, or possible near seizure activity.  Patient has a history of autism, bipolar disorder, as well as seizure disorder, possibly. Per report the patient was at home, playing a video game, when he communicated to a friend also playing the game that he was about to have a seizure, he then called EMS. EMS reports no seizure-like activity, but the patient has had 1 episode of vagal like near syncope after IV insertion.    Home Medications Prior to Admission medications   Medication Sig Start Date End Date Taking? Authorizing Provider  Cyanocobalamin (VITAMIN B-12 IJ) Inject as directed.    [provider]  escitalopram (LEXAPRO) 10 MG tablet Take 10 mg by mouth daily. 04/05/22   [provider]      Allergies    Patient has no known allergies.    Review of Systems   Review of Systems  Neurological:  Positive for seizures.    Physical Exam Updated Vital Signs BP 122/86   Pulse (!) 102   Temp 98.4 F (36.9 C)   Resp 18   Ht 5\' 8"  (1.727 m)   Wt 74.8 kg   SpO2 96%   BMI 25.09 kg/m  Physical Exam Vitals and nursing note reviewed.  Constitutional:      General: He is not in acute distress.    Appearance: He is well-developed.  HENT:     Head: Normocephalic and atraumatic.  Eyes:     Conjunctiva/sclera: Conjunctivae normal.  Cardiovascular:     Rate and Rhythm: Normal rate and regular rhythm.  Pulmonary:     Effort: Pulmonary effort is normal. No respiratory distress.     Breath sounds: No stridor.  Abdominal:     General: There is no distension.  Skin:    General: Skin is warm and dry.  Neurological:     Mental Status: He is alert and oriented to person, place,  and time.     ED Results / Procedures / Treatments   Labs (all labs ordered are listed, but only abnormal results are displayed) Labs Reviewed  COMPREHENSIVE METABOLIC PANEL - Abnormal; Notable for the following components:      Result Value   Potassium 3.3 (*)    Calcium 7.9 (*)    Total Protein 6.3 (*)    Albumin 3.4 (*)    All other components within normal limits  ETHANOL  CBC WITH DIFFERENTIAL/PLATELET  I-STAT CG4 LACTIC ACID, ED    EKG None  Radiology No results found.  Procedures Procedures    Medications Ordered in ED Medications  sodium chloride 0.9 % bolus 1,000 mL (0 mLs Intravenous Stopped 05/21/23 2240)    ED Course/ Medical Decision Making/ A&P                                 Medical Decision Making Young adult male with autism, bipolar disorder presents after possible seizure-like or near seizure activity.  Patient is awake, alert, consideration of seizure versus less likely medication effect or dehydration, electrolyte abnormalities. Labs sent patient placed on continuous monitoring, case discussed with EMS  Amount and/or Complexity  of Data Reviewed Independent Historian: EMS Labs: ordered. Decision-making details documented in ED Course.   10:42 PM Patient in no distress awake, alert, speaking clearly.  He and I discussed all findings, reassuring labs, vital signs, evaluation.  No evidence for ongoing seizure activity.  We also discussed the patient's provisional seizure diagnosis, he notes that he is working with a primary care physician due to that episode. Here, patient is appropriate for discharge given the absence of alarming findings, absence of evidence of infection, dehydration, with some suspicion for the patient's videogame playing contributing to his sensation of near seizure.        Final Clinical Impression(s) / ED Diagnoses Final diagnoses:  Dizziness     Gerhard Munch, MD 05/21/23 2242

## 2023-05-21 NOTE — ED Notes (Addendum)
[employer] worker [phone] gave information for possible ride home.

## 2023-05-21 NOTE — ED Notes (Signed)
Pt ambulatory without any staff assistance, pt maintains steady and equal gait.

## 2024-05-06 ENCOUNTER — Telehealth: Payer: Self-pay

## 2024-05-06 NOTE — Telephone Encounter (Signed)
 Received referral from Dr Sadie at [facility] for a sleep study - Daytime somnolence and snoring. Notes are in EPIC - placed in sleep mailbox
